# Patient Record
Sex: Female | Born: 1975 | Hispanic: Yes | Marital: Married | Smoking: Never smoker
Health system: Southern US, Community
[De-identification: ages and names within clinical notes are randomized; demographics above are authoritative.]

## PROBLEM LIST (undated history)

## (undated) DIAGNOSIS — D8989 Other specified disorders involving the immune mechanism, not elsewhere classified: Secondary | ICD-10-CM

## (undated) DIAGNOSIS — K219 Gastro-esophageal reflux disease without esophagitis: Secondary | ICD-10-CM

## (undated) DIAGNOSIS — IMO0001 Reserved for inherently not codable concepts without codable children: Secondary | ICD-10-CM

## (undated) DIAGNOSIS — J45909 Unspecified asthma, uncomplicated: Secondary | ICD-10-CM

## (undated) DIAGNOSIS — K209 Esophagitis, unspecified without bleeding: Secondary | ICD-10-CM

## (undated) HISTORY — PX: NERVE BIOPSY: SHX1015

## (undated) HISTORY — PX: TONSILLECTOMY: SUR1361

## (undated) HISTORY — PX: SEPTOPLASTY: SUR1290

---

## 2014-07-24 ENCOUNTER — Encounter (HOSPITAL_COMMUNITY): Payer: Self-pay | Admitting: Nurse Practitioner

## 2014-07-24 ENCOUNTER — Emergency Department (HOSPITAL_COMMUNITY): Payer: BLUE CROSS/BLUE SHIELD

## 2014-07-24 ENCOUNTER — Emergency Department (HOSPITAL_COMMUNITY)
Admission: EM | Admit: 2014-07-24 | Discharge: 2014-07-24 | Disposition: A | Discharge status: Home / Self Care | Payer: BLUE CROSS/BLUE SHIELD | Attending: Emergency Medicine | Admitting: Emergency Medicine

## 2014-07-24 DIAGNOSIS — K219 Gastro-esophageal reflux disease without esophagitis: Secondary | ICD-10-CM | POA: Insufficient documentation

## 2014-07-24 DIAGNOSIS — D7218 Eosinophilia in diseases classified elsewhere: Secondary | ICD-10-CM

## 2014-07-24 DIAGNOSIS — Z79899 Other long term (current) drug therapy: Secondary | ICD-10-CM | POA: Diagnosis not present

## 2014-07-24 DIAGNOSIS — R0602 Shortness of breath: Secondary | ICD-10-CM | POA: Diagnosis present

## 2014-07-24 DIAGNOSIS — R000 Tachycardia, unspecified: Secondary | ICD-10-CM | POA: Insufficient documentation

## 2014-07-24 DIAGNOSIS — Z7951 Long term (current) use of inhaled steroids: Secondary | ICD-10-CM | POA: Insufficient documentation

## 2014-07-24 DIAGNOSIS — I313 Pericardial effusion (noninflammatory): Secondary | ICD-10-CM | POA: Insufficient documentation

## 2014-07-24 DIAGNOSIS — Z880 Allergy status to penicillin: Secondary | ICD-10-CM | POA: Diagnosis not present

## 2014-07-24 DIAGNOSIS — M301 Polyarteritis with lung involvement [Churg-Strauss]: Secondary | ICD-10-CM

## 2014-07-24 DIAGNOSIS — J45901 Unspecified asthma with (acute) exacerbation: Secondary | ICD-10-CM | POA: Insufficient documentation

## 2014-07-24 DIAGNOSIS — I3139 Other pericardial effusion (noninflammatory): Secondary | ICD-10-CM

## 2014-07-24 DIAGNOSIS — R079 Chest pain, unspecified: Secondary | ICD-10-CM

## 2014-07-24 HISTORY — DX: Esophagitis, unspecified without bleeding: K20.90

## 2014-07-24 HISTORY — DX: Gastro-esophageal reflux disease without esophagitis: K21.9

## 2014-07-24 HISTORY — DX: Esophagitis, unspecified: K20.9

## 2014-07-24 HISTORY — DX: Reserved for inherently not codable concepts without codable children: IMO0001

## 2014-07-24 HISTORY — DX: Unspecified asthma, uncomplicated: J45.909

## 2014-07-24 HISTORY — DX: Other specified disorders involving the immune mechanism, not elsewhere classified: D89.89

## 2014-07-24 LAB — BASIC METABOLIC PANEL
ANION GAP: 8 (ref 5–15)
BUN: 6 mg/dL (ref 6–20)
CHLORIDE: 102 mmol/L (ref 101–111)
CO2: 27 mmol/L (ref 22–32)
Calcium: 9.2 mg/dL (ref 8.9–10.3)
Creatinine, Ser: 0.69 mg/dL (ref 0.44–1.00)
GFR calc Af Amer: >60 mL/min (ref >60)
GFR calc non Af Amer: >60 mL/min (ref >60)
Glucose, Bld: 100 mg/dL — ABNORMAL HIGH (ref 65–99)
Potassium: 3.5 mmol/L (ref 3.5–5.1)
SODIUM: 137 mmol/L (ref 135–145)

## 2014-07-24 LAB — DIFFERENTIAL
Basophils Absolute: 0.1 10*3/uL (ref 0.0–0.1)
Basophils Relative: 0 % (ref 0–1)
Eosinophils Absolute: 0.1 10*3/uL (ref 0.0–0.7)
Eosinophils Relative: 1 % (ref 0–5)
LYMPHS ABS: 0.9 10*3/uL (ref 0.7–4.0)
Lymphocytes Relative: 8 % — ABNORMAL LOW (ref 12–46)
MONOS PCT: 12 % (ref 3–12)
Monocytes Absolute: 1.3 10*3/uL — ABNORMAL HIGH (ref 0.1–1.0)
NEUTROS ABS: 9 10*3/uL — AB (ref 1.7–7.7)
Neutrophils Relative %: 79 % — ABNORMAL HIGH (ref 43–77)

## 2014-07-24 LAB — I-STAT TROPONIN, ED: Troponin i, poc: 0 ng/mL (ref 0.00–0.08)

## 2014-07-24 LAB — CBC
HCT: 43 % (ref 36.0–46.0)
HEMOGLOBIN: 14.5 g/dL (ref 12.0–15.0)
MCH: 30.8 pg (ref 26.0–34.0)
MCHC: 33.7 g/dL (ref 30.0–36.0)
MCV: 91.3 fL (ref 78.0–100.0)
PLATELETS: 316 10*3/uL (ref 150–400)
RBC: 4.71 MIL/uL (ref 3.87–5.11)
RDW: 14 % (ref 11.5–15.5)
WBC: 11.7 10*3/uL — ABNORMAL HIGH (ref 4.0–10.5)

## 2014-07-24 LAB — SEDIMENTATION RATE: Sed Rate: 24 mm/hr — ABNORMAL HIGH (ref 0–22)

## 2014-07-24 LAB — HCG, SERUM, QUALITATIVE: Preg, Serum: NEGATIVE

## 2014-07-24 LAB — BRAIN NATRIURETIC PEPTIDE: B Natriuretic Peptide: 29.3 pg/mL (ref 0.0–100.0)

## 2014-07-24 MED ORDER — HYDROCODONE-ACETAMINOPHEN 5-325 MG PO TABS: 1.0000 | ORAL_TABLET | ORAL | Status: DC | PRN

## 2014-07-24 MED ORDER — IOHEXOL 350 MG/ML SOLN
80.0000 mL | Freq: Once | INTRAVENOUS | Status: AC | PRN
  Administered 2014-07-24: 80 mL via INTRAVENOUS

## 2014-07-24 MED ORDER — SUCRALFATE 1 G PO TABS: 1.0000 g | ORAL_TABLET | Freq: Four times a day (QID) | ORAL | Status: DC

## 2014-07-24 MED ORDER — ONDANSETRON HCL 4 MG/2ML IJ SOLN
4.0000 mg | Freq: Once | INTRAMUSCULAR | Status: AC
  Administered 2014-07-24: 4 mg via INTRAVENOUS
  Filled 2014-07-24: qty 2

## 2014-07-24 MED ORDER — PREDNISONE 10 MG PO TABS: 30.0000 mg | ORAL_TABLET | Freq: Two times a day (BID) | ORAL | Status: DC

## 2014-07-24 MED ORDER — MORPHINE SULFATE 4 MG/ML IJ SOLN
4.0000 mg | INTRAMUSCULAR | Status: DC | PRN
  Administered 2014-07-24: 4 mg via INTRAVENOUS
  Filled 2014-07-24: qty 1

## 2014-07-24 MED ORDER — METHYLPREDNISOLONE SODIUM SUCC 125 MG IJ SOLR
125.0000 mg | Freq: Once | INTRAMUSCULAR | Status: AC
  Administered 2014-07-24: 125 mg via INTRAVENOUS
  Filled 2014-07-24: qty 2

## 2014-07-24 NOTE — Discharge Instructions (Signed)
Continue with your follow-up appointment on Tuesday with your rheumatologist.  Increase your prednisone to 30 mg per day until your follow-up appointment on Tuesday.  Return with any worsening of symptoms including pain, dizziness, lightheadedness, shortness of breath.

## 2014-07-24 NOTE — ED Notes (Addendum)
She just moved here from [place] and was recently dx with chung strauss syndrome. She reports it causes her to have multiple physical symptoms and complaints. She began to have CP and SOB on wed that she felt was related to the dx but symptoms have gotten worse. She is able to speak in full sentences now but does become mildly labored, A&Ox4

## 2014-07-24 NOTE — ED Notes (Signed)
Patient transported to CT 

## 2014-07-24 NOTE — ED Provider Notes (Signed)
CSN: [id number]     Arrival date & time 07/24/14  1248 History   First MD Initiated Contact with Patient 07/24/14 1314     Chief Complaint  Patient presents with  . Shortness of Breath      HPI  Pt presents with a cc of sob and chest pain.  H/O asthma since 39 y/o.  Patient underwent extensive evaluation after months of exacerbation of symptoms including chest pain. M have pleural effusion. 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  Had small pericardial effusion as well. She moved from [place] to [place] only 2 weeks ago. Has been on a prednisone taper and is down to 5 mg per day. Over the last week has developed recurrence of chest pressure and shortness of breath feels more short of breath lying supine at night. Does not feel like her typical asthma. She presents here.  Nose accompanied her her effusions have been sterilely responsive. She has not required paracentesis, or pericardiocentesis.  Past Medical History  Diagnosis Date  . Autoimmune disorder   . Esophagitis   . Asthma   . Reflux    Past Surgical History  Procedure Laterality Date  . Cesarean section    . Septoplasty    . Tonsillectomy    . Nerve biopsy     History reviewed. No pertinent family history. History  Substance Use Topics  . Smoking status: Never Smoker   . Smokeless tobacco: Not on file  . Alcohol Use: No   OB History    No data available     Review of Systems  Constitutional: Negative for fever, chills, diaphoresis, appetite change and fatigue.  HENT: Negative for mouth sores, sore throat and trouble swallowing.   Eyes: Negative for visual disturbance.  Respiratory: Positive for chest tightness and shortness of breath. Negative for cough and wheezing.   Cardiovascular: Positive for chest pain.  Gastrointestinal: Negative for nausea, vomiting, abdominal pain, diarrhea and abdominal distention.  Endocrine: Negative for polydipsia, polyphagia and  polyuria.  Genitourinary: Negative for dysuria, frequency and hematuria.  Musculoskeletal: Negative for gait problem.  Skin: Negative for color change, pallor and rash.  Neurological: Negative for dizziness, syncope, light-headedness and headaches.  Hematological: Does not bruise/bleed easily.  Psychiatric/Behavioral: Negative for behavioral problems and confusion.      Allergies  Benadryl; Penicillins; and Zantac  Home Medications   Prior to Admission medications   Medication Sig Start Date End Date Taking? Authorizing Provider  azaTHIOprine (IMURAN) 50 MG tablet Take 100 mg by mouth daily.   Yes Historical Provider, MD  Biotin 10 MG TABS Take 10 mg by mouth daily.   Yes Historical Provider, MD  Cyanocobalamin (B-12) 1000 MCG/ML KIT Inject 1,000 mg as directed every 30 (thirty) days.   Yes Historical Provider, MD  Cyanocobalamin (B-12) 5000 MCG CAPS Take 10,000 Units by mouth daily.   Yes Historical Provider, MD  Ferric Carboxymaltose (INJECTAFER) 750 MG/15ML SOLN Inject 750 mg into the vein every 30 (thirty) days.   Yes Historical Provider, MD  fluticasone (FLONASE) 50 MCG/ACT nasal spray Place 2 sprays into both nostrils daily.   Yes Historical Provider, MD  FOLIC ACID PO Take 1 tablet by mouth daily.   Yes Historical Provider, MD  furosemide (LASIX) 20 MG tablet Take 20 mg by mouth as needed for fluid or edema.    Yes Historical Provider, MD  HYDROcodone-acetaminophen (NORCO/VICODIN) 5-325 MG per tablet Take 1 tablet by mouth every 4 (four) hours as needed.  07/24/14   Tanna Furry, MD  ibuprofen (ADVIL,MOTRIN) 200 MG tablet Take 800 mg by mouth every 6 (six) hours as needed for moderate pain.   Yes Historical Provider, MD  lansoprazole (PREVACID) 30 MG capsule Take 30 mg by mouth daily at 12 noon.   Yes Historical Provider, MD  predniSONE (DELTASONE) 10 MG tablet Take 3 tablets (30 mg total) by mouth 2 (two) times daily with a meal. 07/24/14   Tanna Furry, MD  sucralfate (CARAFATE) 1 G  tablet Take 1 tablet (1 g total) by mouth 4 (four) times daily. 07/24/14   Tanna Furry, MD  traMADol (ULTRAM) 50 MG tablet Take 50 mg by mouth every 6 (six) hours as needed for moderate pain.   Yes Historical Provider, MD   BP 106/66 mmHg  Pulse 115  Temp(Src) 98.5 F (36.9 C) (Oral)  Resp 20  Ht 5' 10"  (1.778 m)  Wt 265 lb (120.203 kg)  BMI 38.02 kg/m2  SpO2 95%  LMP 07/17/2014 Physical Exam  Constitutional: She is oriented to person, place, and time. She appears well-developed and well-nourished. No distress.  HENT:  Head: Normocephalic.  Eyes: Conjunctivae are normal. Pupils are equal, round, and reactive to light. No scleral icterus.  Neck: Normal range of motion. Neck supple. No thyromegaly present.  Cardiovascular: Regular rhythm.  Tachycardia present.  Exam reveals no gallop and no friction rub.   No murmur heard. Pulmonary/Chest: Effort normal and breath sounds normal. No respiratory distress. She has no wheezes. She has no rales.  CTA bilaterally.  Abdominal: Soft. Bowel sounds are normal. She exhibits no distension. There is no tenderness. There is no rebound.  Musculoskeletal: Normal range of motion.  Neurological: She is alert and oriented to person, place, and time.  Skin: Skin is warm and dry. No rash noted.  Psychiatric: She has a normal mood and affect. Her behavior is normal.    ED Course  Procedures (including critical care time) Labs Review Labs Reviewed  CBC - Abnormal; Notable for the following:    WBC 11.7 (*)    All other components within normal limits  BASIC METABOLIC PANEL - Abnormal; Notable for the following:    Glucose, Bld 100 (*)    All other components within normal limits  SEDIMENTATION RATE - Abnormal; Notable for the following:    Sed Rate 24 (*)    All other components within normal limits  DIFFERENTIAL - Abnormal; Notable for the following:    Neutrophils Relative % 79 (*)    Neutro Abs 9.0 (*)    Lymphocytes Relative 8 (*)     Monocytes Absolute 1.3 (*)    All other components within normal limits  BRAIN NATRIURETIC PEPTIDE  HCG, SERUM, QUALITATIVE  I-STAT TROPOININ, ED    Imaging Review No results found.   EKG Interpretation   Date/Time:  Saturday July 24 2014 14:35:06 EDT Ventricular Rate:  114 PR Interval:  139 QRS Duration: 90 QT Interval:  464 QTC Calculation: 639 R Axis:   58 Text Interpretation:  Sinus tachycardia Prolonged QT interval Confirmed by  Jeneen Rinks  MD, Rawlings ([id number]) on 07/24/2014 3:24:08 PM      MDM   Final diagnoses:  Chest pain  Churg-Strauss syndrome  Pericardial effusion    Patient underwent CT angios the chest. It shows a small right pleural effusion, and small pericardial effusion. She is not hypotensive. Her tachycardia improves. She is not dyspneic with exertion. States she feels "much much better. I reviewed her extensive paperwork  that she brings with her including discharge summary and notes from her physicians in [place]. They describe her previous effusion as "small, and sterilely responsive". She is quite articulate about her disease. She is given IV's or Lloyd's here. She is given careful return precautions. Cardiology and pulmonary follow-up. Discharged home. Return if any worsening of shortness of breath, chest pain, lightheadedness dizziness weakness fever or other new or worsening symptoms.   Tanna Furry, MD 07/27/14 1524

## 2015-06-15 ENCOUNTER — Emergency Department (HOSPITAL_COMMUNITY)
Admission: EM | Admit: 2015-06-15 | Discharge: 2015-06-15 | Disposition: A | Discharge status: Home / Self Care | Payer: Medicaid Other | Attending: Emergency Medicine | Admitting: Emergency Medicine

## 2015-06-15 ENCOUNTER — Encounter (HOSPITAL_COMMUNITY): Payer: Self-pay | Admitting: Nurse Practitioner

## 2015-06-15 ENCOUNTER — Emergency Department (HOSPITAL_COMMUNITY): Payer: Medicaid Other

## 2015-06-15 DIAGNOSIS — Z7952 Long term (current) use of systemic steroids: Secondary | ICD-10-CM | POA: Diagnosis not present

## 2015-06-15 DIAGNOSIS — Z79899 Other long term (current) drug therapy: Secondary | ICD-10-CM | POA: Diagnosis not present

## 2015-06-15 DIAGNOSIS — Z7951 Long term (current) use of inhaled steroids: Secondary | ICD-10-CM | POA: Diagnosis not present

## 2015-06-15 DIAGNOSIS — Z880 Allergy status to penicillin: Secondary | ICD-10-CM | POA: Diagnosis not present

## 2015-06-15 DIAGNOSIS — K219 Gastro-esophageal reflux disease without esophagitis: Secondary | ICD-10-CM | POA: Diagnosis not present

## 2015-06-15 DIAGNOSIS — J45901 Unspecified asthma with (acute) exacerbation: Secondary | ICD-10-CM | POA: Insufficient documentation

## 2015-06-15 DIAGNOSIS — R079 Chest pain, unspecified: Secondary | ICD-10-CM | POA: Insufficient documentation

## 2015-06-15 DIAGNOSIS — R0602 Shortness of breath: Secondary | ICD-10-CM

## 2015-06-15 DIAGNOSIS — M797 Fibromyalgia: Secondary | ICD-10-CM | POA: Diagnosis not present

## 2015-06-15 LAB — CBC WITH DIFFERENTIAL/PLATELET
BASOS PCT: 1 %
Basophils Absolute: 0 10*3/uL (ref 0.0–0.1)
EOS ABS: 0.6 10*3/uL (ref 0.0–0.7)
EOS PCT: 7 %
HCT: 40.6 % (ref 36.0–46.0)
HEMOGLOBIN: 13.1 g/dL (ref 12.0–15.0)
LYMPHS ABS: 1.7 10*3/uL (ref 0.7–4.0)
Lymphocytes Relative: 20 %
MCH: 28.9 pg (ref 26.0–34.0)
MCHC: 32.3 g/dL (ref 30.0–36.0)
MCV: 89.6 fL (ref 78.0–100.0)
MONOS PCT: 12 %
Monocytes Absolute: 1 10*3/uL (ref 0.1–1.0)
NEUTROS PCT: 60 %
Neutro Abs: 5.4 10*3/uL (ref 1.7–7.7)
PLATELETS: 314 10*3/uL (ref 150–400)
RBC: 4.53 MIL/uL (ref 3.87–5.11)
RDW: 13.5 % (ref 11.5–15.5)
WBC: 8.7 10*3/uL (ref 4.0–10.5)

## 2015-06-15 LAB — BASIC METABOLIC PANEL
Anion gap: 9 (ref 5–15)
BUN: 12 mg/dL (ref 6–20)
CALCIUM: 9 mg/dL (ref 8.9–10.3)
CHLORIDE: 106 mmol/L (ref 101–111)
CO2: 25 mmol/L (ref 22–32)
CREATININE: 0.54 mg/dL (ref 0.44–1.00)
Glucose, Bld: 112 mg/dL — ABNORMAL HIGH (ref 65–99)
Potassium: 3.4 mmol/L — ABNORMAL LOW (ref 3.5–5.1)
SODIUM: 140 mmol/L (ref 135–145)

## 2015-06-15 LAB — I-STAT TROPONIN, ED: TROPONIN I, POC: 0 ng/mL (ref 0.00–0.08)

## 2015-06-15 MED ORDER — ASPIRIN 81 MG PO CHEW
324.0000 mg | CHEWABLE_TABLET | Freq: Once | ORAL | Status: AC
  Administered 2015-06-15: 324 mg via ORAL
  Filled 2015-06-15: qty 4

## 2015-06-15 MED ORDER — IOPAMIDOL (ISOVUE-370) INJECTION 76%
100.0000 mL | Freq: Once | INTRAVENOUS | Status: AC | PRN
  Administered 2015-06-15: 100 mL via INTRAVENOUS

## 2015-06-15 MED ORDER — KETOROLAC TROMETHAMINE 30 MG/ML IJ SOLN
30.0000 mg | Freq: Once | INTRAMUSCULAR | Status: AC
  Administered 2015-06-15: 30 mg via INTRAVENOUS
  Filled 2015-06-15: qty 1

## 2015-06-15 NOTE — ED Notes (Signed)
PA at bedside.

## 2015-06-15 NOTE — ED Notes (Signed)
Patient presents today for complaints of chest pain and shortness of breath. Patient rates pain at a 6 and describes it as sharp and burning. She has not taken anything to help with the pain. She has a history of pericarditis, pleural effusions, churg-strauss syndrome, and fibromyalgia. She feels that her current pain is similar to that of previous episodes of pericarditis.

## 2015-06-15 NOTE — ED Provider Notes (Signed)
CSN: [id number]     Arrival date & time 06/15/15  1828 History   First MD Initiated Contact with Patient 06/15/15 1900     Chief Complaint  Patient presents with  . Chest Pain  . Shortness of Breath     (Consider location/radiation/quality/duration/timing/severity/associated sxs/prior Treatment) HPI Comments: Patient with a history of Pleural Effusion, Pericarditis, and Fibromyalgia presents today with complaints of chest pain and SOB.  She reports onset of chest pain yesterday and states that it has been constant since that time.  She describes it as a pressure and also feeling tight.  Pain is located left anterior chest and radiates through to the back.  She reports that the pain is worse with movement, when lying down, and also when taking a deep breath.  She has not taken anything for pain prior to arrival.  She denies fever, chills, cough, hemoptysis, nausea, vomiting, LE pain/swelling, dizziness, or syncope.  She denies any prior history of CAD.  She denies history of PE or DVT.  No prolonged travel or surgeries in the past 4 weeks.  No exogenous estrogen use.    Patient is a 40 y.o. female presenting with chest pain and shortness of breath. The history is provided by the patient.  Chest Pain Associated symptoms: shortness of breath   Shortness of Breath Associated symptoms: chest pain     Past Medical History  Diagnosis Date  . Autoimmune disorder (HCC)   . Esophagitis   . Asthma   . Reflux    Past Surgical History  Procedure Laterality Date  . Cesarean section    . Septoplasty    . Tonsillectomy    . Nerve biopsy     Family History  Problem Relation Age of Onset  . Asthma Mother   . Thyroid disease Mother   . Hypertension Mother   . Anxiety disorder Mother   . Hyperlipidemia Mother   . Varicose Veins Mother   . Hypertension Father   . Asthma Sister   . Migraines Sister   . Hypertension Sister   . Asthma Brother   . Thrombophlebitis Brother   . Hypertension  Brother   . Diabetes Brother    Social History  Substance Use Topics  . Smoking status: Never Smoker   . Smokeless tobacco: None  . Alcohol Use: No   OB History    Gravida Para Term Preterm AB TAB SAB Ectopic Multiple Living   1 0 0 0 0 0 0 0 0 0      Review of Systems  Respiratory: Positive for shortness of breath.   Cardiovascular: Positive for chest pain.  All other systems reviewed and are negative.     Allergies  Benadryl; Penicillins; and Zantac  Home Medications   Prior to Admission medications   Medication Sig Start Date End Date Taking? Authorizing Provider  azaTHIOprine (IMURAN) 50 MG tablet Take 100 mg by mouth daily.   Yes Historical Provider, MD  Biotin 10 MG TABS Take 10 mg by mouth daily.   Yes Historical Provider, MD  Cyanocobalamin (B-12) 5000 MCG CAPS Take 10,000 Units by mouth every other day.    Yes Historical Provider, MD  fluticasone (FLONASE) 50 MCG/ACT nasal spray Place 2 sprays into both nostrils daily.   Yes Historical Provider, MD  FOLIC ACID PO Take 1 tablet by mouth daily.   Yes Historical Provider, MD  ibuprofen (ADVIL,MOTRIN) 200 MG tablet Take 800 mg by mouth every 6 (six) hours as needed for moderate pain.  Yes Historical Provider, MD  lansoprazole (PREVACID) 30 MG capsule Take 30 mg by mouth 2 (two) times daily.    Yes Historical Provider, MD  predniSONE (DELTASONE) 5 MG tablet Take 5 mg by mouth daily with breakfast.  06/03/15  Yes Historical Provider, MD  HYDROcodone-acetaminophen (NORCO/VICODIN) 5-325 MG per tablet Take 1 tablet by mouth every 4 (four) hours as needed. Patient not taking: Reported on 06/15/2015 07/24/14   Rolland PorterMark James, MD  predniSONE (DELTASONE) 10 MG tablet Take 3 tablets (30 mg total) by mouth 2 (two) times daily with a meal. Patient not taking: Reported on 06/15/2015 07/24/14   Rolland PorterMark James, MD  sucralfate (CARAFATE) 1 G tablet Take 1 tablet (1 g total) by mouth 4 (four) times daily. Patient not taking: Reported on 06/15/2015  07/24/14   Rolland PorterMark James, MD   BP 128/79 mmHg  Pulse 91  Resp 17  SpO2 99%  LMP 06/15/2015 (Exact Date) Physical Exam  Constitutional: She appears well-developed and well-nourished.  HENT:  Head: Normocephalic and atraumatic.  Mouth/Throat: Oropharynx is clear and moist.  Neck: Normal range of motion. Neck supple.  Cardiovascular: Normal rate, regular rhythm and normal heart sounds.  Exam reveals no friction rub.   No murmur heard. Pulmonary/Chest: Effort normal and breath sounds normal. No respiratory distress. She has no wheezes. She has no rales. She exhibits tenderness.  Abdominal: Soft. Bowel sounds are normal. She exhibits no distension and no mass. There is no tenderness. There is no rebound and no guarding.  Musculoskeletal: Normal range of motion.  Neurological: She is alert.  Skin: Skin is warm and dry. She is not diaphoretic.  Psychiatric: She has a normal mood and affect.  Nursing note and vitals reviewed.   ED Course  Procedures (including critical care time) Labs Review Labs Reviewed  BASIC METABOLIC PANEL - Abnormal; Notable for the following:    Potassium 3.4 (*)    Glucose, Bld 112 (*)    All other components within normal limits  CBC WITH DIFFERENTIAL/PLATELET  [facility] SensorI-STAT TROPOININ, ED    Imaging Review Dg Chest 2 View  06/15/2015  CLINICAL DATA:  40 year old female pain with chest pain EXAM: CHEST  2 VIEW COMPARISON:  None. FINDINGS: The heart size and mediastinal contours are within normal limits. Both lungs are clear. The visualized skeletal structures are unremarkable. IMPRESSION: No active cardiopulmonary disease. Electronically Signed   By: Elgie CollardArash  Radparvar M.D.   On: 06/15/2015 20:21   Ct Angio Chest Pe W/cm &/or Wo Cm  06/15/2015  CLINICAL DATA:  Left-sided chest pain and shortness of breath for 2 days. EXAM: CT ANGIOGRAPHY CHEST WITH CONTRAST TECHNIQUE: Multidetector CT imaging of the chest was performed using the standard protocol during bolus  administration of intravenous contrast. Multiplanar CT image reconstructions and MIPs were obtained to evaluate the vascular anatomy. CONTRAST:  100 mL Isovue 370 COMPARISON:  None. FINDINGS: Mediastinum/Lymph Nodes: No pulmonary emboli or thoracic aortic dissection identified. No evidence thoracic aortic aneurysm. Normal heart size. No masses or pathologically enlarged lymph nodes identified. Lungs/Pleura: No pulmonary mass, infiltrate, or effusion. Upper abdomen: No acute findings. Musculoskeletal: No chest wall mass or suspicious bone lesions identified. Incidental note made of a benign-appearing hemangioma in the T9 vertebral body. Review of the MIP images confirms the above findings. IMPRESSION: Negative. No evidence of pulmonary embolism or other active disease within the thorax. Electronically Signed   By: Myles RosenthalJohn  Stahl M.D.   On: 06/15/2015 21:30   I have personally reviewed and evaluated these images  and lab results as part of my medical decision-making.   EKG Interpretation None      MDM   Final diagnoses:  Chest pain   Patient presents today with complaints of SOB and chest pain.  She has a history of Pleural Effusion and Pericarditis and states that symptoms feel similar.  EKG without ischemic changes.  Troponin negative.  CXR is negative.  Discussed CXR results with the patient.  She is very concerned and is requesting a Chest CT.  She states that she has had abnormalities on Chest CT that has not shown up on CXR in the past and has had prolonged hospitalizations for this.  Chest CT today is negative.  Patient is not hypoxic and does not have any signs of respiratory distress.  Stable for discharge.  Return precautions given.    Santiago Glad, PA-C 06/16/15 0020  Bethann Berkshire, MD 06/16/15 [phone]

## 2015-06-15 NOTE — ED Notes (Signed)
Bed: [id number] Expected date: 06/15/15 Expected time:  Means of arrival:  Comments: Hold for Triage 2

## 2015-10-11 ENCOUNTER — Emergency Department (HOSPITAL_COMMUNITY)
Admission: EM | Admit: 2015-10-11 | Discharge: 2015-10-11 | Disposition: A | Discharge status: Home / Self Care | Payer: BLUE CROSS/BLUE SHIELD | Attending: Emergency Medicine | Admitting: Emergency Medicine

## 2015-10-11 ENCOUNTER — Encounter (HOSPITAL_COMMUNITY): Payer: Self-pay

## 2015-10-11 ENCOUNTER — Emergency Department (HOSPITAL_COMMUNITY): Payer: BLUE CROSS/BLUE SHIELD

## 2015-10-11 DIAGNOSIS — Z79899 Other long term (current) drug therapy: Secondary | ICD-10-CM | POA: Insufficient documentation

## 2015-10-11 DIAGNOSIS — J45909 Unspecified asthma, uncomplicated: Secondary | ICD-10-CM | POA: Insufficient documentation

## 2015-10-11 DIAGNOSIS — K30 Functional dyspepsia: Secondary | ICD-10-CM | POA: Insufficient documentation

## 2015-10-11 DIAGNOSIS — R0789 Other chest pain: Secondary | ICD-10-CM

## 2015-10-11 LAB — BASIC METABOLIC PANEL
ANION GAP: 6 (ref 5–15)
BUN: 13 mg/dL (ref 6–20)
CO2: 30 mmol/L (ref 22–32)
Calcium: 9.4 mg/dL (ref 8.9–10.3)
Chloride: 101 mmol/L (ref 101–111)
Creatinine, Ser: 0.73 mg/dL (ref 0.44–1.00)
Glucose, Bld: 88 mg/dL (ref 65–99)
POTASSIUM: 3.5 mmol/L (ref 3.5–5.1)
SODIUM: 137 mmol/L (ref 135–145)

## 2015-10-11 LAB — CBC
HEMATOCRIT: 37.5 % (ref 36.0–46.0)
Hemoglobin: 12.5 g/dL (ref 12.0–15.0)
MCH: 30 pg (ref 26.0–34.0)
MCHC: 33.3 g/dL (ref 30.0–36.0)
MCV: 89.9 fL (ref 78.0–100.0)
Platelets: 338 10*3/uL (ref 150–400)
RBC: 4.17 MIL/uL (ref 3.87–5.11)
RDW: 13.4 % (ref 11.5–15.5)
WBC: 13.4 10*3/uL — AB (ref 4.0–10.5)

## 2015-10-11 LAB — I-STAT TROPONIN, ED: Troponin i, poc: 0 ng/mL (ref 0.00–0.08)

## 2015-10-11 MED ORDER — ALUM & MAG HYDROXIDE-SIMETH 200-200-20 MG/5ML PO SUSP
15.0000 mL | Freq: Once | ORAL | Status: AC
  Administered 2015-10-11: 15 mL via ORAL
  Filled 2015-10-11: qty 30

## 2015-10-11 MED ORDER — LIDOCAINE VISCOUS 2 % MT SOLN
10.0000 mL | Freq: Once | OROMUCOSAL | Status: AC
  Administered 2015-10-11: 10 mL via OROMUCOSAL
  Filled 2015-10-11: qty 15

## 2015-10-11 MED ORDER — OMEPRAZOLE 20 MG PO CPDR: 20.0000 mg | DELAYED_RELEASE_CAPSULE | Freq: Every day | ORAL | 0 refills | Status: DC

## 2015-10-11 NOTE — Discharge Instructions (Signed)
Please read attached information. If you experience any new or worsening signs or symptoms please return to the emergency room for evaluation. Please follow-up with your primary care provider or specialist as discussed. Please use medication prescribed only as directed and discontinue taking if you have any concerning signs or symptoms.   °

## 2015-10-11 NOTE — ED Triage Notes (Signed)
Pt states that she has had chest pain and SOB since Saturday. 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 Alert and oriented.

## 2015-10-11 NOTE — ED Provider Notes (Signed)
[facility]-EMERGENCY DEPT Provider Note   CSN: [id number] Arrival date & time: 10/11/15  1801   History   Chief Complaint Chief Complaint  Patient presents with  . Chest Pain  . Shortness of Breath    HPI Allison Obrien is a 40 y.o. female.  HPI   40 year old female presents today with numerous complaints. Patient reports that she is on probiotics and digestive enzymes. She notes that one week ago she stopped using these as she could not longer afford them. She reports shortly after she started to develop indigestion, chest pain, upper abdominal pain. She reports the symptoms are similar to previous, reports that symptoms are worse when laying back. She notes that she received her medications in the mail yesterday and will begin taking them tomorrow. Patient is concerned as she's had similar symptoms in the past. Chart review shows the patient has had numerous ED visits for similar presentations, requesting CT and urinalysis showing no pulmonary embolism, and also very small pleural effusions. Patient denies any history of DVT or PE, she denies any significant risk factors today. Patient denies any significant cardiac history. Patient also reports that the chest pain is reproducible, noting extreme tenderness to palpation of her sternum. Symptoms are worse after eating.   Past Medical History:  Diagnosis Date  . Asthma   . Autoimmune disorder (HCC)   . Esophagitis   . Reflux     There are no active problems to display for this patient.   Past Surgical History:  Procedure Laterality Date  . CESAREAN SECTION    . NERVE BIOPSY    . SEPTOPLASTY    . TONSILLECTOMY      OB History    Gravida Para Term Preterm AB Living   1 0 0 0 0 0   SAB TAB Ectopic Multiple Live Births   0 0 0 0         Home Medications    Prior to Admission medications   Medication Sig Start Date End Date Taking? Authorizing Provider  azaTHIOprine (IMURAN) 50 MG tablet Take 100 mg by mouth daily.    Yes Historical Provider, MD  Biotin 10 MG TABS Take 10 mg by mouth daily.   Yes Historical Provider, MD  Cyanocobalamin (B-12) 5000 MCG CAPS Take 10,000 Units by mouth every other day.    Yes Historical Provider, MD  fluticasone (FLONASE) 50 MCG/ACT nasal spray Place 2 sprays into both nostrils daily.   Yes Historical Provider, MD  FOLIC ACID PO Take 1 tablet by mouth daily.   Yes Historical Provider, MD  ibuprofen (ADVIL,MOTRIN) 200 MG tablet Take 800 mg by mouth every 6 (six) hours as needed for moderate pain.   Yes Historical Provider, MD  lansoprazole (PREVACID) 30 MG capsule Take 30 mg by mouth 2 (two) times daily.    Yes Historical Provider, MD  predniSONE (DELTASONE) 5 MG tablet Take 5 mg by mouth daily with breakfast.  06/03/15  Yes Historical Provider, MD  HYDROcodone-acetaminophen (NORCO/VICODIN) 5-325 MG per tablet Take 1 tablet by mouth every 4 (four) hours as needed. Patient not taking: Reported on 06/15/2015 07/24/14   Rolland Porter, MD  omeprazole (PRILOSEC) 20 MG capsule Take 1 capsule (20 mg total) by mouth daily. 10/11/15   Eyvonne Mechanic, PA-C  predniSONE (DELTASONE) 10 MG tablet Take 3 tablets (30 mg total) by mouth 2 (two) times daily with a meal. Patient not taking: Reported on 06/15/2015 07/24/14   Rolland Porter, MD  sucralfate (  CARAFATE) 1 G tablet Take 1 tablet (1 g total) by mouth 4 (four) times daily. Patient not taking: Reported on 06/15/2015 07/24/14   Rolland Porter, MD    Family History Family History  Problem Relation Age of Onset  . Asthma Mother   . Thyroid disease Mother   . Hypertension Mother   . Anxiety disorder Mother   . Hyperlipidemia Mother   . Varicose Veins Mother   . Hypertension Father   . Asthma Sister   . Migraines Sister   . Hypertension Sister   . Asthma Brother   . Thrombophlebitis Brother   . Hypertension Brother   . Diabetes Brother     Social History Social History  Substance Use Topics  . Smoking status: Never Smoker  . Smokeless tobacco:  Not on file  . Alcohol use No     Allergies   Benadryl [diphenhydramine]; Penicillins; and Zantac [ranitidine hcl]   Review of Systems Review of Systems  All other systems reviewed and are negative.   Physical Exam Updated Vital Signs BP 133/78   Pulse 62   Temp 98.1 F (36.7 C) (Oral)   Resp 18   LMP 10/04/2015 (Approximate)   SpO2 100%   Physical Exam  Constitutional: She is oriented to person, place, and time. She appears well-developed and well-nourished.  HENT:  Head: Normocephalic and atraumatic.  Eyes: Conjunctivae are normal. Pupils are equal, round, and reactive to light. Right eye exhibits no discharge. Left eye exhibits no discharge. No scleral icterus.  Neck: Normal range of motion. No JVD present. No tracheal deviation present.  Cardiovascular: Normal rate, regular rhythm, normal heart sounds and intact distal pulses.  Exam reveals no gallop and no friction rub.   No murmur heard. Pulmonary/Chest: Effort normal and breath sounds normal. No stridor. No respiratory distress. She has no wheezes. She has no rales. She exhibits no tenderness.  Exquisite tenderness to light palpation of the sternum  Abdominal: Soft. She exhibits no distension. There is no tenderness.  Musculoskeletal: She exhibits no edema.  Neurological: She is alert and oriented to person, place, and time. Coordination normal.  Skin: Skin is warm and dry.  Psychiatric: She has a normal mood and affect. Her behavior is normal. Judgment and thought content normal.  Nursing note and vitals reviewed.   ED Treatments / Results  Labs (all labs ordered are listed, but only abnormal results are displayed) Labs Reviewed  CBC - Abnormal; Notable for the following:       Result Value   WBC 13.4 (*)    All other components within normal limits  BASIC METABOLIC PANEL  I-STAT TROPOININ, ED    EKG  EKG Interpretation None       Radiology Dg Chest 2 View  Result Date: 10/11/2015 CLINICAL DATA:   Chest pain, shortness of breath. EXAM: CHEST  2 VIEW COMPARISON:  Radiographs of Jun 15, 2015. FINDINGS: The heart size and mediastinal contours are within normal limits. Both lungs are clear. No pneumothorax or pleural effusion is noted. The visualized skeletal structures are unremarkable. IMPRESSION: No active cardiopulmonary disease. Electronically Signed   By: Lupita Raider, M.D.   On: 10/11/2015 19:30    Procedures Procedures (including critical care time)  Medications Ordered in ED Medications  lidocaine (XYLOCAINE) 2 % viscous mouth solution 10 mL (10 mLs Mouth/Throat Given 10/11/15 2114)  alum & mag hydroxide-simeth (MAALOX/MYLANTA) 200-200-20 MG/5ML suspension 15 mL (15 mLs Oral Given 10/11/15 2127)     Initial Impression /  Assessment and Plan / ED Course  I have reviewed the triage vital signs and the nursing notes.  Pertinent labs & imaging results that were available during my care of the patient were reviewed by me and considered in my medical decision making (see chart for details).  Clinical Course    Final Clinical Impressions(s) / ED Diagnoses   Final diagnoses:  Indigestion  Chest wall pain   Labs: I-STAT troponin, BNP, CBC  Imaging: DG chest 2 view  Consults:  Therapeutics:  Discharge Meds:   Assessment/Plan:  40-year-old female presents today with numerous complaints. Patient maintained complaint is indigestion and chest pain. This is likely due to recent change in provider X and enzymes support. Patient reports symptoms are worse after eating, mildly improved here in the ED with lidocaine and Maalox. Very low suspicion for any cardiovascular, pulmonary etiology in this patient. Patient has no adventitious lung sounds or signs of pleural effusions. She has a clean chest x-ray and reassuring vital signs. Patient was tachycardic initially here in the ED, she was anxious and distracted by the events on the TV. Patient will be instructed to use omeprazole, follow  up with primary care for reevaluation. She is given strict return precautions, she verbalized understanding and agreement to today's plan had no further questions or concerns at time of discharge     New Prescriptions Discharge Medication List as of 10/11/2015 10:50 PM    START taking these medications   Details  omeprazole (PRILOSEC) 20 MG capsule Take 1 capsule (20 mg total) by mouth daily., Starting Tue 10/11/2015, Print         Eyvonne MechanicJeffrey Amulya Quintin, PA-C 10/12/15 [id number]    Benjiman CoreNathan Pickering, MD 10/12/15 [phone]

## 2015-10-12 ENCOUNTER — Emergency Department (HOSPITAL_COMMUNITY)
Admission: EM | Admit: 2015-10-12 | Discharge: 2015-10-13 | Disposition: A | Discharge status: Home / Self Care | Payer: BLUE CROSS/BLUE SHIELD | Attending: Emergency Medicine | Admitting: Emergency Medicine

## 2015-10-12 ENCOUNTER — Emergency Department (HOSPITAL_COMMUNITY): Payer: BLUE CROSS/BLUE SHIELD

## 2015-10-12 ENCOUNTER — Encounter (HOSPITAL_COMMUNITY): Payer: Self-pay | Admitting: Emergency Medicine

## 2015-10-12 DIAGNOSIS — J45909 Unspecified asthma, uncomplicated: Secondary | ICD-10-CM | POA: Insufficient documentation

## 2015-10-12 DIAGNOSIS — J189 Pneumonia, unspecified organism: Secondary | ICD-10-CM | POA: Diagnosis not present

## 2015-10-12 DIAGNOSIS — R0602 Shortness of breath: Secondary | ICD-10-CM | POA: Diagnosis present

## 2015-10-12 DIAGNOSIS — Z79899 Other long term (current) drug therapy: Secondary | ICD-10-CM | POA: Insufficient documentation

## 2015-10-12 DIAGNOSIS — Z8679 Personal history of other diseases of the circulatory system: Secondary | ICD-10-CM

## 2015-10-12 LAB — CBC
HEMATOCRIT: 40.7 % (ref 36.0–46.0)
HEMOGLOBIN: 13 g/dL (ref 12.0–15.0)
MCH: 29.3 pg (ref 26.0–34.0)
MCHC: 31.9 g/dL (ref 30.0–36.0)
MCV: 91.9 fL (ref 78.0–100.0)
Platelets: 321 10*3/uL (ref 150–400)
RBC: 4.43 MIL/uL (ref 3.87–5.11)
RDW: 13.5 % (ref 11.5–15.5)
WBC: 15.1 10*3/uL — AB (ref 4.0–10.5)

## 2015-10-12 LAB — I-STAT TROPONIN, ED: Troponin i, poc: 0 ng/mL (ref 0.00–0.08)

## 2015-10-12 MED ORDER — ALBUTEROL SULFATE (2.5 MG/3ML) 0.083% IN NEBU
5.0000 mg | INHALATION_SOLUTION | Freq: Once | RESPIRATORY_TRACT | Status: AC
  Administered 2015-10-12: 5 mg via RESPIRATORY_TRACT

## 2015-10-12 MED ORDER — ALBUTEROL SULFATE (2.5 MG/3ML) 0.083% IN NEBU
INHALATION_SOLUTION | RESPIRATORY_TRACT | Status: AC
  Filled 2015-10-12: qty 6

## 2015-10-12 NOTE — ED Triage Notes (Signed)
Pt. reports progressing SOB with central chest pressure onset last Sunday , denies cough , no fever or chills.

## 2015-10-13 ENCOUNTER — Emergency Department (HOSPITAL_COMMUNITY): Payer: BLUE CROSS/BLUE SHIELD

## 2015-10-13 ENCOUNTER — Encounter (HOSPITAL_COMMUNITY): Payer: Self-pay | Admitting: Radiology

## 2015-10-13 LAB — BASIC METABOLIC PANEL
ANION GAP: 10 (ref 5–15)
BUN: 8 mg/dL (ref 6–20)
CHLORIDE: 99 mmol/L — AB (ref 101–111)
CO2: 27 mmol/L (ref 22–32)
Calcium: 9.7 mg/dL (ref 8.9–10.3)
Creatinine, Ser: 0.65 mg/dL (ref 0.44–1.00)
GFR calc Af Amer: >60 mL/min (ref >60)
GFR calc non Af Amer: >60 mL/min (ref >60)
GLUCOSE: 124 mg/dL — AB (ref 65–99)
POTASSIUM: 3.5 mmol/L (ref 3.5–5.1)
SODIUM: 136 mmol/L (ref 135–145)

## 2015-10-13 LAB — D-DIMER, QUANTITATIVE (NOT AT ARMC): D DIMER QUANT: 0.62 ug{FEU}/mL — AB (ref 0.00–0.50)

## 2015-10-13 MED ORDER — IOPAMIDOL (ISOVUE-370) INJECTION 76%
INTRAVENOUS | Status: AC
  Administered 2015-10-13: 100 mL
  Filled 2015-10-13: qty 100

## 2015-10-13 MED ORDER — HYDROCODONE-ACETAMINOPHEN 5-325 MG PO TABS
1.0000 | ORAL_TABLET | Freq: Once | ORAL | Status: AC
  Administered 2015-10-13: 1 via ORAL
  Filled 2015-10-13: qty 1

## 2015-10-13 MED ORDER — INDOMETHACIN 25 MG PO CAPS
50.0000 mg | ORAL_CAPSULE | Freq: Once | ORAL | Status: AC
  Administered 2015-10-13: 50 mg via ORAL
  Filled 2015-10-13: qty 2

## 2015-10-13 MED ORDER — INDOMETHACIN 50 MG PO CAPS: 50.0000 mg | ORAL_CAPSULE | Freq: Two times a day (BID) | ORAL | 0 refills | Status: DC

## 2015-10-13 MED ORDER — AZITHROMYCIN 250 MG PO TABS: 250.0000 mg | ORAL_TABLET | Freq: Every day | ORAL | 0 refills | Status: DC

## 2015-10-13 NOTE — ED Provider Notes (Signed)
[facility]-EMERGENCY DEPT Provider Note   CSN: [id number] Arrival date & time: 10/12/15  2301  By signing my name below, I, Allison Obrien, attest that this documentation has been prepared under the direction and in the presence of Shon Baton, MD . Electronically Signed: Christel Obrien, Scribe. 10/13/2015. 12:55 AM.    History   Chief Complaint Chief Complaint  Patient presents with  . Shortness of Breath    The history is provided by the patient. No language interpreter was used.   HPI Comments:  Allison Obrien is a 40 y.o. female with PMHx of esophagitis and pericarditis who presents to the Emergency Department wit multiple complaints. Pt complains of sudden onset, worsening shortness of breath and 10/10 chest pain x4 days. Pt reports that the chest pain is exacerbated when changing positions. Worse with laying flat. Pt further complains of associated headache, GERD, chills, and subjective fever. Pt notes that these symptoms are similar to her prior percarditis. Pt was seen at [facility] and was discharged. Patient reports that she has had worsening recent indigestion given that she has not been taking an enzyme that she takes for this.  Past Medical History:  Diagnosis Date  . Asthma   . Autoimmune disorder (HCC)   . Esophagitis   . Reflux     There are no active problems to display for this patient.   Past Surgical History:  Procedure Laterality Date  . CESAREAN SECTION    . NERVE BIOPSY    . SEPTOPLASTY    . TONSILLECTOMY      OB History    Gravida Para Term Preterm AB Living   1 0 0 0 0 0   SAB TAB Ectopic Multiple Live Births   0 0 0 0         Home Medications    Prior to Admission medications   Medication Sig Start Date End Date Taking? Authorizing Provider  azaTHIOprine (IMURAN) 50 MG tablet Take 100 mg by mouth daily.   Yes Historical Provider, MD  Biotin 10 MG TABS Take 10 mg by mouth daily.   Yes Historical Provider, MD    Cyanocobalamin (B-12) 5000 MCG CAPS Take 10,000 Units by mouth every other day.    Yes Historical Provider, MD  fluticasone (FLONASE) 50 MCG/ACT nasal spray Place 2 sprays into both nostrils daily.   Yes Historical Provider, MD  FOLIC ACID PO Take 1 tablet by mouth daily.   Yes Historical Provider, MD  ibuprofen (ADVIL,MOTRIN) 200 MG tablet Take 800 mg by mouth every 6 (six) hours as needed for moderate pain.   Yes Historical Provider, MD  lansoprazole (PREVACID) 30 MG capsule Take 30 mg by mouth 2 (two) times daily.    Yes Historical Provider, MD  omeprazole (PRILOSEC) 20 MG capsule Take 1 capsule (20 mg total) by mouth daily. 10/11/15  Yes Jeffrey Hedges, PA-C  predniSONE (DELTASONE) 5 MG tablet Take 5 mg by mouth daily with breakfast.  06/03/15  Yes Historical Provider, MD  azithromycin (ZITHROMAX) 250 MG tablet Take 1 tablet (250 mg total) by mouth daily. Take first 2 tablets together, then 1 every day until finished. 10/13/15   Shon Baton, MD  HYDROcodone-acetaminophen (NORCO/VICODIN) 5-325 MG per tablet Take 1 tablet by mouth every 4 (four) hours as needed. Patient not taking: Reported on 06/15/2015 07/24/14   Rolland Porter, MD  indomethacin (INDOCIN) 50 MG capsule Take 1 capsule (50 mg total) by mouth 2 (two) times daily  with a meal. 10/13/15   Shon Baton, MD  predniSONE (DELTASONE) 10 MG tablet Take 3 tablets (30 mg total) by mouth 2 (two) times daily with a meal. Patient not taking: Reported on 06/15/2015 07/24/14   Rolland Porter, MD  sucralfate (CARAFATE) 1 G tablet Take 1 tablet (1 g total) by mouth 4 (four) times daily. Patient not taking: Reported on 06/15/2015 07/24/14   Rolland Porter, MD    Family History Family History  Problem Relation Age of Onset  . Asthma Mother   . Thyroid disease Mother   . Hypertension Mother   . Anxiety disorder Mother   . Hyperlipidemia Mother   . Varicose Veins Mother   . Hypertension Father   . Asthma Sister   . Migraines Sister   . Hypertension  Sister   . Asthma Brother   . Thrombophlebitis Brother   . Hypertension Brother   . Diabetes Brother     Social History Social History  Substance Use Topics  . Smoking status: Never Smoker  . Smokeless tobacco: Never Used  . Alcohol use No     Allergies   Avelox [moxifloxacin hcl in nacl]; Benadryl [diphenhydramine]; Penicillins; and Zantac [ranitidine hcl]   Review of Systems Review of Systems  Constitutional: Positive for chills and fever.  Respiratory: Positive for shortness of breath. Negative for cough.   Cardiovascular: Positive for chest pain.  Neurological: Positive for headaches.  All other systems reviewed and are negative.    Physical Exam Updated Vital Signs BP 117/69   Pulse 92   Temp 99.2 F (37.3 C) (Oral)   Resp 17   LMP 10/05/2015 (Approximate)   SpO2 96%   Physical Exam  Constitutional: She is oriented to person, place, and time.  Obese  HENT:  Head: Normocephalic and atraumatic.  Eyes: Pupils are equal, round, and reactive to light.  Cardiovascular: Normal rate, regular rhythm and normal heart sounds.   No murmur heard. Pulmonary/Chest: Effort normal. No respiratory distress. She has no wheezes.  Abdominal: Soft. Bowel sounds are normal.  Neurological: She is alert and oriented to person, place, and time.  Skin: Skin is warm and dry. Rash noted.  Psychiatric: She has a normal mood and affect.  Nursing note and vitals reviewed.    ED Treatments / Results  DIAGNOSTIC STUDIES:  Oxygen Saturation is 98% on RA, normal by my interpretation.    COORDINATION OF CARE:  12:55 AM Discussed treatment plan with pt at bedside and pt agreed to plan.  Labs (all labs ordered are listed, but only abnormal results are displayed) Labs Reviewed  BASIC METABOLIC PANEL - Abnormal; Notable for the following:       Result Value   Chloride 99 (*)    Glucose, Bld 124 (*)    All other components within normal limits  CBC - Abnormal; Notable for the  following:    WBC 15.1 (*)    All other components within normal limits  D-DIMER, QUANTITATIVE (NOT AT [facility]) - Abnormal; Notable for the following:    D-Dimer, Quant 0.62 (*)    All other components within normal limits  I-STAT TROPOININ, ED    EKG  EKG Interpretation  Date/Time:  Wednesday October 12 2015 23:03:53 EDT Ventricular Rate:  123 PR Interval:  164 QRS Duration: 92 QT Interval:  316 QTC Calculation: 452 R Axis:   17 Text Interpretation:  Sinus tachycardia Biatrial enlargement T wave abnormality, consider inferior ischemia Abnormal ECG Confirmed by Kelilah Hebard  MD, Khi Mcmillen (  [id number]) on 10/13/2015 12:51:34 AM       Radiology Dg Chest 2 View  Result Date: 10/13/2015 CLINICAL DATA:  40 year old female with shortness of breath and chest pain EXAM: CHEST  2 VIEW COMPARISON:  Chest radiograph dated 10/11/2015 FINDINGS: Two views of the chest demonstrate mild eventration of the left hemidiaphragm. There is associated subsegmental atelectatic changes of the left lung base. Superimposed pneumonia is not excluded. The right lung is clear. There is no pleural effusion or pneumothorax. The cardiac silhouette is within normal limits. No acute osseous pathology. IMPRESSION: Left lung base subsegmental atelectatic changes versus less likely infiltrate. Clinical correlation is recommended. Electronically Signed   By: Elgie CollardArash  Radparvar M.D.   On: 10/13/2015 00:06   Dg Chest 2 View  Result Date: 10/11/2015 CLINICAL DATA:  Chest pain, shortness of breath. EXAM: CHEST  2 VIEW COMPARISON:  Radiographs of Jun 15, 2015. FINDINGS: The heart size and mediastinal contours are within normal limits. Both lungs are clear. No pneumothorax or pleural effusion is noted. The visualized skeletal structures are unremarkable. IMPRESSION: No active cardiopulmonary disease. Electronically Signed   By: Lupita RaiderJames  Green Jr, M.D.   On: 10/11/2015 19:30   Ct Angio Chest Pe W And/or Wo Contrast  Result Date: 10/13/2015 CLINICAL  DATA:  Acute onset of worsening shortness of breath and central chest pressure. Initial encounter. EXAM: CT ANGIOGRAPHY CHEST WITH CONTRAST TECHNIQUE: Multidetector CT imaging of the chest was performed using the standard protocol during bolus administration of intravenous contrast. Multiplanar CT image reconstructions and MIPs were obtained to evaluate the vascular anatomy. CONTRAST:  100 mL of Isovue 370 IV contrast COMPARISON:  Chest radiograph performed 10/12/2015, and CTA of the chest performed 06/15/2015 FINDINGS: There is no evidence of significant pulmonary embolus. Bibasilar airspace opacities, left greater than right, raise concern for multifocal pneumonia. There is no evidence of significant focal consolidation, pleural effusion or pneumothorax. No masses are identified; no abnormal focal contrast enhancement is seen. Trace pericardial fluid likely remains within normal limits. No mediastinal lymphadenopathy is seen. The great vessels are grossly unremarkable in appearance. No axillary lymphadenopathy is seen. The visualized portions of the thyroid gland are unremarkable in appearance. The visualized portions of the liver and spleen are unremarkable. The visualized portions of the pancreas, gallbladder, stomach, adrenal glands and kidneys are within normal limits. No acute osseous abnormalities are seen. A vertebral body hemangioma is noted at T7. Review of the MIP images confirms the above findings. IMPRESSION: 1. No evidence of significant pulmonary embolus. 2. Bibasilar airspace opacities, left greater than right, raise concern for multifocal pneumonia. 3. 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 Electronically Signed   By: Roanna RaiderJeffery  Chang M.D.   On: 10/13/2015 03:18    Procedures Procedures (including critical care time)    EMERGENCY DEPARTMENT US CARDIAC EXAM "Study: Limited Ultrasound of the heart and pericardium"  INDICATIONS:Tachycardia Multiple views of the heart and pericardium are obtained  with a multi-frequency probe.  PERFORMED UE:AVWUJWBY:Myself  IMAGES ARCHIVED?: Yes  FINDINGS: No pericardial effusion  LIMITATIONS:  Body habitus  VIEWS USED: Parasternal long axis  INTERPRETATION: Pericardial effusioin absent  COMMENT:  On limited study, no pericardial effusion noted, good squeeze   Medications Ordered in ED Medications  albuterol (PROVENTIL) (2.5 MG/3ML) 0.083% nebulizer solution 5 mg (5 mg Nebulization Given 10/12/15 2313)  indomethacin (INDOCIN) capsule 50 mg (50 mg Oral Given 10/13/15 0104)  HYDROcodone-acetaminophen (NORCO/VICODIN) 5-325 MG per tablet 1 tablet (1 tablet Oral Given 10/13/15 0104)  iopamidol (ISOVUE-370) 76 %  injection (100 mLs  Contrast Given 10/13/15 0228)     Initial Impression / Assessment and Plan / ED Course  I have reviewed the triage vital signs and the nursing notes.  Pertinent labs & imaging results that were available during my care of the patient were reviewed by me and considered in my medical decision making (see chart for details).  Clinical Course    Patient presents with chest pain and shortness of breath. Also reports chills and worsening indigestion. There are components of her story that are consistent with her prior pericarditis. She has positional chest discomfort. No murmur. No evidence of large pleural effusion on ultrasound. She was noted to be tachycardic initially. Afebrile. Initial lab work is notable for leukocytosis and a positive d-dimer. CT angios negative for pulmonary embolism. However, suggestive for possible pneumonia. Patient does report chills and has leukocytosis. On recheck, patient's chest pain improved with indomethacin. She likely needs formal echocardiogram. She has multiple antibiotic allergies including penicillins and fluoroquinolones. For this reason, she will be placed on azithromycin to cover for pneumonia. She will also be placed on a course of indomethacin and I have encouraged her to follow-up with her  cardiologist for formal echocardiogram. Patient stated understanding.  After history, exam, and medical workup I feel the patient has been appropriately medically screened and is safe for discharge home. Pertinent diagnoses were discussed with the patient. Patient was given return precautions.   Final Clinical Impressions(s) / ED Diagnoses   Final diagnoses:  Community acquired pneumonia  History of pericarditis    New Prescriptions New Prescriptions   AZITHROMYCIN (ZITHROMAX) 250 MG TABLET    Take 1 tablet (250 mg total) by mouth daily. Take first 2 tablets together, then 1 every day until finished.   INDOMETHACIN (INDOCIN) 50 MG CAPSULE    Take 1 capsule (50 mg total) by mouth 2 (two) times daily with a meal.   I personally performed the services described in this documentation, which was scribed in my presence. The recorded information has been reviewed and is accurate.     Shon Baton, MD 10/13/15 [phone]

## 2015-10-13 NOTE — Discharge Instructions (Signed)
You were seen today for chest pain and shortness of breath. He did not have any large effusion on bedside ultrasound. However, given your history of pericarditis, he will be treated with indomethacin. You also had some evidence of possible pneumonia on imaging.[facility] [place].  Givenher allergies, you were placed on azithromycin. If you have worsening symptoms she needs to be reevaluated immediately.

## 2015-10-13 NOTE — ED Notes (Signed)
Unable to initiate IV x1.  2nd RN to initiate at this time.

## 2015-10-13 NOTE — ED Notes (Signed)
Patient taken to CT.

## 2015-10-13 NOTE — ED Notes (Signed)
Patient returned from CT

## 2015-10-17 ENCOUNTER — Emergency Department (HOSPITAL_COMMUNITY): Payer: BLUE CROSS/BLUE SHIELD

## 2015-10-17 ENCOUNTER — Emergency Department (HOSPITAL_COMMUNITY)
Admission: EM | Admit: 2015-10-17 | Discharge: 2015-10-17 | Disposition: A | Discharge status: Home / Self Care | Payer: BLUE CROSS/BLUE SHIELD | Attending: Emergency Medicine | Admitting: Emergency Medicine

## 2015-10-17 ENCOUNTER — Encounter (HOSPITAL_COMMUNITY): Payer: Self-pay | Admitting: Emergency Medicine

## 2015-10-17 DIAGNOSIS — E119 Type 2 diabetes mellitus without complications: Secondary | ICD-10-CM | POA: Diagnosis not present

## 2015-10-17 DIAGNOSIS — R0602 Shortness of breath: Secondary | ICD-10-CM

## 2015-10-17 DIAGNOSIS — I10 Essential (primary) hypertension: Secondary | ICD-10-CM | POA: Diagnosis not present

## 2015-10-17 DIAGNOSIS — J45909 Unspecified asthma, uncomplicated: Secondary | ICD-10-CM | POA: Diagnosis not present

## 2015-10-17 DIAGNOSIS — R079 Chest pain, unspecified: Secondary | ICD-10-CM

## 2015-10-17 LAB — I-STAT TROPONIN, ED
TROPONIN I, POC: 0.03 ng/mL (ref 0.00–0.08)
TROPONIN I, POC: 0.02 ng/mL (ref 0.00–0.08)

## 2015-10-17 LAB — COMPREHENSIVE METABOLIC PANEL
ALT: 57 U/L — ABNORMAL HIGH (ref 14–54)
AST: 63 U/L — ABNORMAL HIGH (ref 15–41)
Albumin: 3.2 g/dL — ABNORMAL LOW (ref 3.5–5.0)
Alkaline Phosphatase: 112 U/L (ref 38–126)
Anion gap: 7 (ref 5–15)
BUN: 8 mg/dL (ref 6–20)
CHLORIDE: 102 mmol/L (ref 101–111)
CO2: 27 mmol/L (ref 22–32)
Calcium: 9 mg/dL (ref 8.9–10.3)
Creatinine, Ser: 0.61 mg/dL (ref 0.44–1.00)
Glucose, Bld: 93 mg/dL (ref 65–99)
POTASSIUM: 3.8 mmol/L (ref 3.5–5.1)
Sodium: 136 mmol/L (ref 135–145)
Total Bilirubin: 0.5 mg/dL (ref 0.3–1.2)
Total Protein: 6.8 g/dL (ref 6.5–8.1)

## 2015-10-17 LAB — CBC
HCT: 36.2 % (ref 36.0–46.0)
Hemoglobin: 11.5 g/dL — ABNORMAL LOW (ref 12.0–15.0)
MCH: 29.4 pg (ref 26.0–34.0)
MCHC: 31.8 g/dL (ref 30.0–36.0)
MCV: 92.6 fL (ref 78.0–100.0)
PLATELETS: 393 10*3/uL (ref 150–400)
RBC: 3.91 MIL/uL (ref 3.87–5.11)
RDW: 13.6 % (ref 11.5–15.5)
WBC: 10.9 10*3/uL — AB (ref 4.0–10.5)

## 2015-10-17 LAB — I-STAT BETA HCG BLOOD, ED (MC, WL, AP ONLY)

## 2015-10-17 MED ORDER — HYDROCODONE-ACETAMINOPHEN 5-325 MG PO TABS
2.0000 | ORAL_TABLET | Freq: Once | ORAL | Status: AC
  Administered 2015-10-17: 2 via ORAL
  Filled 2015-10-17: qty 2

## 2015-10-17 MED ORDER — HYDROCODONE-ACETAMINOPHEN 5-325 MG PO TABS: 1.0000 | ORAL_TABLET | Freq: Four times a day (QID) | ORAL | 0 refills | Status: DC | PRN

## 2015-10-17 MED ORDER — COLCHICINE 0.6 MG PO TABS: 0.6000 mg | ORAL_TABLET | Freq: Two times a day (BID) | ORAL | 0 refills | Status: DC

## 2015-10-17 NOTE — ED Provider Notes (Signed)
[facility]-EMERGENCY DEPT Provider Note   CSN: [id number] Arrival date & time: 10/17/15  0607     History   Chief Complaint Chief Complaint  Patient presents with  . Shortness of Breath  . Chest Pain    HPI Allison Obrien is a 40 y.o. female.  40yo F w/ PMH including Churg Strauss, HTN, T2DM, pericarditis, anxiety who p/w CP and SOB. She has had at least 1 week of SOB as well as sharp, L sided chest pain that is at times severe and worse lying flat, better sitting up. She was evaluated here twice for these symptoms and her workup has showed negative troponins, CTA of chest that was negative for PE but showed possible infiltrates. She was given a course of azithromycin but states that she took azithromycin that she had at home which was only a 3 day course of 500 mg. She was also sent home with indomethacin to treat pericarditis but she stopped taking it because she was waking up with sweats and chills and saw that this could be a medication side effect. She reports that the chest pain became severe last night at 11 PM and she has tingling and pain down her left arm with hand numbness. No vomiting, diarrhea, or measured fevers. She has an appointment with her pulmonologist tomorrow. She has not seen a cardiologist anytime recently.   The history is provided by the patient.  Shortness of Breath  Associated symptoms include chest pain.  Chest Pain   Associated symptoms include shortness of breath.    Past Medical History:  Diagnosis Date  . Asthma   . Autoimmune disorder (HCC)   . Esophagitis   . Reflux     There are no active problems to display for this patient.   Past Surgical History:  Procedure Laterality Date  . CESAREAN SECTION    . NERVE BIOPSY    . SEPTOPLASTY    . TONSILLECTOMY      OB History    Gravida Para Term Preterm AB Living   1 0 0 0 0 0   SAB TAB Ectopic Multiple Live Births   0 0 0 0         Home Medications    Prior to Admission  medications   Medication Sig Start Date End Date Taking? Authorizing Provider  azaTHIOprine (IMURAN) 50 MG tablet Take 100 mg by mouth daily.   Yes Historical Provider, MD  Biotin 10 MG TABS Take 10 mg by mouth daily.   Yes Historical Provider, MD  Cyanocobalamin (B-12) 5000 MCG CAPS Take 10,000 Units by mouth every other day.    Yes Historical Provider, MD  fluticasone (FLONASE) 50 MCG/ACT nasal spray Place 2 sprays into both nostrils daily.   Yes Historical Provider, MD  FOLIC ACID PO Take 1 tablet by mouth daily.   Yes Historical Provider, MD  ibuprofen (ADVIL,MOTRIN) 200 MG tablet Take 800 mg by mouth every 6 (six) hours as needed for moderate pain.   Yes Historical Provider, MD  indomethacin (INDOCIN) 50 MG capsule Take 1 capsule (50 mg total) by mouth 2 (two) times daily with a meal. 10/13/15  Yes Shon Baton, MD  lansoprazole (PREVACID) 30 MG capsule Take 30 mg by mouth 2 (two) times daily.    Yes Historical Provider, MD  predniSONE (DELTASONE) 5 MG tablet Take 5 mg by mouth daily with breakfast.  06/03/15  Yes Historical Provider, MD  colchicine 0.6 MG tablet Take 1 tablet (  0.6 mg total) by mouth 2 (two) times daily. 10/17/15   Laurence Spates, MD  HYDROcodone-acetaminophen (NORCO/VICODIN) 5-325 MG tablet Take 1-2 tablets by mouth every 6 (six) hours as needed. 10/17/15   Laurence Spates, MD  omeprazole (PRILOSEC) 20 MG capsule Take 1 capsule (20 mg total) by mouth daily. Patient not taking: Reported on 10/17/2015 10/11/15   Eyvonne Mechanic, PA-C  predniSONE (DELTASONE) 10 MG tablet Take 3 tablets (30 mg total) by mouth 2 (two) times daily with a meal. Patient not taking: Reported on 06/15/2015 07/24/14   Rolland Porter, MD    Family History Family History  Problem Relation Age of Onset  . Asthma Mother   . Thyroid disease Mother   . Hypertension Mother   . Anxiety disorder Mother   . Hyperlipidemia Mother   . Varicose Veins Mother   . Hypertension Father   . Asthma Sister   .  Migraines Sister   . Hypertension Sister   . Asthma Brother   . Thrombophlebitis Brother   . Hypertension Brother   . Diabetes Brother     Social History Social History  Substance Use Topics  . Smoking status: Never Smoker  . Smokeless tobacco: Never Used  . Alcohol use No     Allergies   Avelox [moxifloxacin hcl in nacl]; Benadryl [diphenhydramine]; Penicillins; and Zantac [ranitidine hcl]   Review of Systems Review of Systems  Respiratory: Positive for shortness of breath.   Cardiovascular: Positive for chest pain.   10 Systems reviewed and are negative for acute change except as noted in the HPI.   Physical Exam Updated Vital Signs BP 115/81   Pulse 103   Temp 98.2 F (36.8 C) (Oral)   Resp 26   Ht 5\' 9"  (1.753 m)   Wt 242 lb (109.8 kg)   LMP 10/05/2015 (Approximate)   SpO2 98%   BMI 35.74 kg/m   Physical Exam  Constitutional: She is oriented to person, place, and time. She appears well-developed and well-nourished. No distress.  anxious  HENT:  Head: Normocephalic and atraumatic.  Moist mucous membranes  Eyes: Conjunctivae are normal. Pupils are equal, round, and reactive to light.  Neck: Neck supple.  Cardiovascular: Normal rate, regular rhythm and normal heart sounds.   No murmur heard. Pulmonary/Chest: Effort normal and breath sounds normal.  Abdominal: Soft. Bowel sounds are normal. She exhibits no distension. There is no tenderness.  Musculoskeletal: She exhibits no edema.  Neurological: She is alert and oriented to person, place, and time.  Fluent speech  Skin: Skin is warm and dry.  Psychiatric: Judgment normal.  anxious  Nursing note and vitals reviewed.    ED Treatments / Results  Labs (all labs ordered are listed, but only abnormal results are displayed) Labs Reviewed  CBC - Abnormal; Notable for the following:       Result Value   WBC 10.9 (*)    Hemoglobin 11.5 (*)    All other components within normal limits  COMPREHENSIVE  METABOLIC PANEL - Abnormal; Notable for the following:    Albumin 3.2 (*)    AST 63 (*)    ALT 57 (*)    All other components within normal limits  I-STAT TROPOININ, ED  I-STAT BETA HCG BLOOD, ED ([facility], [facility], [facility] ONLY)  I-STAT TROPOININ, ED    EKG  EKG Interpretation  Date/Time:  Monday October 17 2015 06:18:55 EDT Ventricular Rate:  101 PR Interval:    QRS Duration: 88 QT Interval:  348  QTC Calculation: 452 R Axis:   29 Text Interpretation:  Sinus tachycardia Borderline T wave abnormalities Confirmed by Park[facility] Behavior[facility] Health SystemALUMBO-RASCH  MD, APRIL ([id number]) on 10/17/2015 6:28:58 AM       Radiology Dg Chest 2 View  Result Date: 10/17/2015 CLINICAL DATA:  Chest pain, shortness of Breath EXAM: CHEST  2 VIEW COMPARISON:  10/13/2015 FINDINGS: Cardiomediastinal silhouette is stable. There is streaky infiltrate/ pneumonia in left lower lobe retrocardiac. No pulmonary edema. Bony thorax is unremarkable. IMPRESSION: No pulmonary edema. Streaky infiltrate/ pneumonia in left lower lobe retrocardiac best seen on lateral view. Follow-up to resolution after appropriate treatment is recommended. Electronically Signed   By: Natasha MeadLiviu  Pop M.D.   On: 10/17/2015 10:14    Procedures Procedures (including critical care time)  Medications Ordered in ED Medications  HYDROcodone-acetaminophen (NORCO/VICODIN) 5-325 MG per tablet 2 tablet (2 tablets Oral Given 10/17/15 [id number])     Initial Impression / Assessment and Plan / ED Course  I have reviewed the triage vital signs and the nursing notes.  Pertinent labs & imaging results that were available during my care of the patient were reviewed by me and considered in my medical decision making (see chart for details).  Clinical Course   Pt w/ ongoing Left-sided chest pain and shortness of breath. She has had 2 ED visit evaluations which have shown no evidence of PE or ACS. She was anxious on exam but vital signs stable. No abnormal lung sounds. Afebrile. EKG similar to previous.  Her description of chest pain as better sitting up and worse laying flat, similar to her previous episode of pericarditis, suggests another episode of pericarditis. Bedside ultrasound a few days ago was negative for large effusion.  Her labs here including serial troponins are reassuring. Given that her symptoms have been constant for 1 week, I doubt ACS. She is afebrile with normal O2 saturation and reassuring pulmonary exam, therefore I will hold on any further antibiotic course and defer to her pulmonologist, who she will see tomorrow.  I have discussed the importance of follow-up with her specialists, pulmonology and cardiology, regarding ongoing treatment. I also reviewed return precautions. Discussed supportive care pericarditis including colchicine and Norco for breakthrough pain. Patient voiced understanding of plan and discharged in satisfactory condition.  Final Clinical Impressions(s) / ED Diagnoses   Final diagnoses:  Shortness of breath  Chest pain, unspecified chest pain type    New Prescriptions Discharge Medication List as of 10/17/2015  1:58 PM    START taking these medications   Details  colchicine 0.6 MG tablet Take 1 tablet (0.6 mg total) by mouth 2 (two) times daily., Starting Mon 10/17/2015, Print         Laurence Spatesachel Morgan Little, MD 10/17/15 [phone]

## 2015-10-17 NOTE — ED Triage Notes (Addendum)
Pt in from home with "sob x 1 wk". States sharp, L sided chest pain began last night at 11pm. Pt states pain is unrelieved by Tylenol. Hx of pericarditis, Churg Strauss syndrome and pleural effusions.Sats 98% on RA, alert, ambulatory, speaking in full sentences, very anxious

## 2016-02-02 ENCOUNTER — Emergency Department (HOSPITAL_COMMUNITY): Payer: BC Managed Care – PPO

## 2016-02-02 ENCOUNTER — Encounter (HOSPITAL_COMMUNITY): Payer: Self-pay | Admitting: *Deleted

## 2016-02-02 DIAGNOSIS — R0789 Other chest pain: Secondary | ICD-10-CM | POA: Insufficient documentation

## 2016-02-02 DIAGNOSIS — J45909 Unspecified asthma, uncomplicated: Secondary | ICD-10-CM | POA: Insufficient documentation

## 2016-02-02 DIAGNOSIS — R071 Chest pain on breathing: Secondary | ICD-10-CM | POA: Diagnosis present

## 2016-02-02 LAB — COMPREHENSIVE METABOLIC PANEL
ALBUMIN: 4 g/dL (ref 3.5–5.0)
ALK PHOS: 61 U/L (ref 38–126)
ALT: 20 U/L (ref 14–54)
ANION GAP: 9 (ref 5–15)
AST: 23 U/L (ref 15–41)
BUN: 9 mg/dL (ref 6–20)
CALCIUM: 9.3 mg/dL (ref 8.9–10.3)
CO2: 23 mmol/L (ref 22–32)
Chloride: 105 mmol/L (ref 101–111)
Creatinine, Ser: 0.73 mg/dL (ref 0.44–1.00)
GFR calc Af Amer: >60 mL/min (ref >60)
GFR calc non Af Amer: >60 mL/min (ref >60)
GLUCOSE: 132 mg/dL — AB (ref 65–99)
Potassium: 3.9 mmol/L (ref 3.5–5.1)
SODIUM: 137 mmol/L (ref 135–145)
Total Bilirubin: 0.4 mg/dL (ref 0.3–1.2)
Total Protein: 6.9 g/dL (ref 6.5–8.1)

## 2016-02-02 LAB — URINALYSIS, ROUTINE W REFLEX MICROSCOPIC
Bilirubin Urine: NEGATIVE
Glucose, UA: NEGATIVE mg/dL
KETONES UR: NEGATIVE mg/dL
LEUKOCYTES UA: NEGATIVE
Nitrite: NEGATIVE
PH: 5 (ref 5.0–8.0)
PROTEIN: NEGATIVE mg/dL
SQUAMOUS EPITHELIAL / LPF: NONE SEEN
Specific Gravity, Urine: 1.013 (ref 1.005–1.030)

## 2016-02-02 LAB — CBC WITH DIFFERENTIAL/PLATELET
BASOS PCT: 0 %
Basophils Absolute: 0 10*3/uL (ref 0.0–0.1)
EOS ABS: 0.2 10*3/uL (ref 0.0–0.7)
Eosinophils Relative: 2 %
HCT: 39.4 % (ref 36.0–46.0)
HEMOGLOBIN: 12.6 g/dL (ref 12.0–15.0)
Lymphocytes Relative: 16 %
Lymphs Abs: 1.4 10*3/uL (ref 0.7–4.0)
MCH: 27.6 pg (ref 26.0–34.0)
MCHC: 32 g/dL (ref 30.0–36.0)
MCV: 86.2 fL (ref 78.0–100.0)
Monocytes Absolute: 0.9 10*3/uL (ref 0.1–1.0)
Monocytes Relative: 10 %
NEUTROS PCT: 72 %
Neutro Abs: 6.1 10*3/uL (ref 1.7–7.7)
Platelets: 364 10*3/uL (ref 150–400)
RBC: 4.57 MIL/uL (ref 3.87–5.11)
RDW: 20.6 % — ABNORMAL HIGH (ref 11.5–15.5)
WBC: 8.6 10*3/uL (ref 4.0–10.5)

## 2016-02-02 LAB — I-STAT TROPONIN, ED: Troponin i, poc: 0 ng/mL (ref 0.00–0.08)

## 2016-02-02 NOTE — ED Triage Notes (Signed)
Patient presents with c/o chest discomfort with deeps breaths.  States she has an autoimmune disease and when this happens she is having a flair up

## 2016-02-02 NOTE — ED Notes (Signed)
Name called for vital sign reassessment. No answer. Was told by others in waiting room pt may possibly [place] food [place] [employer].

## 2016-02-03 ENCOUNTER — Emergency Department (HOSPITAL_COMMUNITY)
Admission: EM | Admit: 2016-02-03 | Discharge: 2016-02-03 | Disposition: A | Discharge status: Home / Self Care | Payer: BC Managed Care – PPO | Attending: Emergency Medicine | Admitting: Emergency Medicine

## 2016-02-03 DIAGNOSIS — R079 Chest pain, unspecified: Secondary | ICD-10-CM

## 2016-02-03 LAB — I-STAT TROPONIN, ED: TROPONIN I, POC: 0 ng/mL (ref 0.00–0.08)

## 2016-02-03 LAB — D-DIMER, QUANTITATIVE (NOT AT ARMC): D DIMER QUANT: 0.33 ug{FEU}/mL (ref 0.00–0.50)

## 2016-02-03 MED ORDER — IBUPROFEN 800 MG PO TABS: 800.0000 mg | ORAL_TABLET | Freq: Three times a day (TID) | ORAL | 0 refills | Status: AC

## 2016-02-03 MED ORDER — PREDNISONE 10 MG (21) PO TBPK: 10.0000 mg | ORAL_TABLET | Freq: Every day | ORAL | 0 refills | Status: DC

## 2016-02-03 MED ORDER — METHYLPREDNISOLONE SODIUM SUCC 125 MG IJ SOLR
125.0000 mg | Freq: Once | INTRAMUSCULAR | Status: AC
  Administered 2016-02-03: 125 mg via INTRAVENOUS
  Filled 2016-02-03: qty 2

## 2016-02-03 MED ORDER — KETOROLAC TROMETHAMINE 30 MG/ML IJ SOLN
30.0000 mg | Freq: Once | INTRAMUSCULAR | Status: AC
  Administered 2016-02-03: 30 mg via INTRAVENOUS
  Filled 2016-02-03: qty 1

## 2016-02-03 MED ORDER — HYDROCODONE-ACETAMINOPHEN 5-325 MG PO TABS: 1.0000 | ORAL_TABLET | Freq: Four times a day (QID) | ORAL | 0 refills | Status: DC | PRN

## 2016-02-03 NOTE — Discharge Instructions (Signed)
Please start her steroid taper in the morning. Please take all of this medication until complete and then resume your 5 mg once a day. Please follow-up with your rheumatologist. You had negative cardiac workup today, clear chest x-ray and normal vital signs. Please take ibuprofen 800 mg every 8 hours with food for the next week.

## 2016-02-03 NOTE — ED Notes (Addendum)
Pt states that she is having pain and pressure in her chest when she lays down that started on Saturday. Pt states she increased her prednisone by 10 mg for a total of 15 mg which usually helps her. Pt states she called her rheumatologist and they told her to come to the hospital this afternoon however she could not come in this afternoon, thus she is here this morning.   Pt states the pain comes on when she breaths and moves.

## 2016-02-03 NOTE — ED Provider Notes (Signed)
By signing my name below, I, Modena Jansky, attest that this documentation has been prepared under the direction and in the presence of Kristen N Ward, DO . Electronically Signed: Modena Jansky, Scribe. 02/03/2016. 1:02 AM.  TIME SEEN: 1:02 AM  CHIEF COMPLAINT: Chest pain  HPI:  HPI Comments: Allison Obrien is a 40 y.o. 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 She states has been having pain when she lays down, palpate this area takes a deep breath. She recently had her dose of prednisone increased to 15 mg for the past 3 days. She is chronically on prednisone 5 mg in am you're on 100 mg daily. She has associated fatigue, shortness of breath. No fever or cough. No history of PE or DVT. No lower extremity swelling or pain. States she was told by her rheumatologist to come to the emergency department. States she has never had to have a thoracentesis or pericardiocentesis before.   Rheumatologist: Dr. Freida Busman, [facility]     ROS: See HPI Constitutional: no fever  Eyes: no drainage  ENT: no runny nose   Cardiovascular:  +chest pain  Resp: +  SOB  GI: no vomiting GU: no dysuria Integumentary: no rash  Allergy: no hives  Musculoskeletal: no leg swelling  Neurological: no slurred speech ROS otherwise negative  PAST MEDICAL HISTORY/PAST SURGICAL HISTORY:  Past Medical History:  Diagnosis Date  . Asthma   . Autoimmune disorder (HCC)   . Esophagitis   . Reflux     MEDICATIONS:  Prior to Admission medications   Medication Sig Start Date End Date Taking? Authorizing Provider  azaTHIOprine (IMURAN) 50 MG tablet Take 100 mg by mouth daily.    Historical Provider, MD  Biotin 10 MG TABS Take 10 mg by mouth daily.    Historical Provider, MD  colchicine 0.6 MG tablet Take 1 tablet (0.6 mg total) by mouth 2 (two)  times daily. 10/17/15   Laurence Spates, MD  Cyanocobalamin (B-12) 5000 MCG CAPS Take 10,000 Units by mouth every other day.     Historical Provider, MD  fluticasone (FLONASE) 50 MCG/ACT nasal spray Place 2 sprays into both nostrils daily.    Historical Provider, MD  FOLIC ACID PO Take 1 tablet by mouth daily.    Historical Provider, MD  HYDROcodone-acetaminophen (NORCO/VICODIN) 5-325 MG tablet Take 1-2 tablets by mouth every 6 (six) hours as needed. 10/17/15   Laurence Spates, MD  ibuprofen (ADVIL,MOTRIN) 200 MG tablet Take 800 mg by mouth every 6 (six) hours as needed for moderate pain.    Historical Provider, MD  indomethacin (INDOCIN) 50 MG capsule Take 1 capsule (50 mg total) by mouth 2 (two) times daily with a meal. 10/13/15   Shon Baton, MD  lansoprazole (PREVACID) 30 MG capsule Take 30 mg by mouth 2 (two) times daily.     Historical Provider, MD  omeprazole (PRILOSEC) 20 MG capsule Take 1 capsule (20 mg total) by mouth daily. Patient not taking: Reported on 10/17/2015 10/11/15   Eyvonne Mechanic, PA-C  predniSONE (DELTASONE) 10 MG tablet Take 3 tablets (30 mg total) by mouth 2 (two) times daily with a meal. Patient not taking: Reported on 06/15/2015 07/24/14   Rolland Porter, MD  predniSONE (DELTASONE) 5 MG tablet Take 5 mg by mouth daily with breakfast.  06/03/15   Historical Provider, MD  ALLERGIES:  Allergies  Allergen Reactions  . Avelox [Moxifloxacin Hcl In Nacl] Shortness Of Breath    Rash   . Benadryl [Diphenhydramine] Shortness Of Breath  . Penicillins Shortness Of Breath    Has patient had a PCN reaction causing immediate rash, facial/tongue/throat swelling, SOB or lightheadedness with hypotension: Yes Has patient had a PCN reaction causing severe rash involving mucus membranes or skin necrosis: Yes Has patient had a PCN reaction that required hospitalization Yes Has patient had a PCN reaction occurring within the last 10 years: No If all of the above answers are "NO",  then may proceed with Cephalosporin use.   Orpha Bur. Zantac [Ranitidine Hcl] Shortness Of Breath    SOCIAL HISTORY:  Social History  Substance Use Topics  . Smoking status: Never Smoker  . Smokeless tobacco: Never Used  . Alcohol use No    FAMILY HISTORY: Family History  Problem Relation Age of Onset  . Asthma Mother   . Thyroid disease Mother   . Hypertension Mother   . Anxiety disorder Mother   . Hyperlipidemia Mother   . Varicose Veins Mother   . Hypertension Father   . Asthma Sister   . Migraines Sister   . Hypertension Sister   . Asthma Brother   . Thrombophlebitis Brother   . Hypertension Brother   . Diabetes Brother     EXAM: BP 116/72 (BP Location: Right Arm)   Pulse 71   Temp 97.9 F (36.6 C) (Oral)   Resp 15   Ht 5\' 10"  (1.778 m)   Wt 247 lb 11.2 oz (112.4 kg)   LMP 01/17/2016 Comment: has IUD   SpO2 100%   BMI 35.54 kg/m  CONSTITUTIONAL: Alert and oriented and responds appropriately to questions. Well-appearing; well-nourished HEAD: Normocephalic EYES: Conjunctivae clear, PERRL, EOMI ENT: normal nose; no rhinorrhea; moist mucous membranes NECK: Supple, no meningismus, no nuchal rigidity, no LAD  CARD: RRR; S1 and S2 appreciated; no murmurs, no clicks, no rubs, no gallops  CHEST:  Tender to palpation over the left chest wall without crepitus, ecchymosis or deformity. RESP: Normal chest excursion without splinting or tachypnea; breath sounds clear and equal bilaterally; no wheezes, no rhonchi, no rales, no hypoxia or respiratory distress, speaking full sentences ABD/GI: Normal bowel sounds; non-distended; soft, non-tender, no rebound, no guarding, no peritoneal signs, no hepatosplenomegaly BACK:  The back appears normal and is non-tender to palpation, there is no CVA tenderness EXT: Normal ROM in all joints; non-tender to palpation; no edema; normal capillary refill; no cyanosis, no calf tenderness or swelling    SKIN: Normal color for age and race; warm; no  rash NEURO: Moves all extremities equally, sensation to light touch intact diffusely, cranial nerves II through XII intact, normal speech PSYCH: The patient's mood and manner are appropriate. Grooming and personal hygiene are appropriate.   MEDICAL DECISION MAKING: Patient here with chest pain. Has had similar symptoms before with her autoimmune disorder. Denies history of ACS, PE or DVT. First cardiac labs is negative. Chest x-ray shows no pleural effusion, cardiomegaly, edema. No signs of volume overload on exam. Plan is to repeat second troponin, d-dimer. Will treat symptomatically with Toradol, Solu-Medrol and reassess.  ED PROGRESS: 3:10 AM  Patient reports her pain has improved. A second troponin is negative. D-dimer negative. Hemodynamically stable. She feels this is similar to her autoimmune flares and pericarditis. We'll discharge home with prescription for ibuprofen to take for the next week and a steroid taper. We'll have her follow-up with  her rheumatologist. Will give cardiology follow-up as well for this area. Discussed return cautions. Patient verbalizes understanding and is comfort with this plan.   At this time, I do not feel there is any life-threatening condition present. I have reviewed and discussed all results (EKG, imaging, lab, urine as appropriate) and exam findings with patient/family. I have reviewed nursing notes and appropriate previous records.  I feel the patient is safe to be discharged home without further emergent workup and can continue workup as an outpatient as needed. Discussed usual and customary return precautions. Patient/family verbalize understanding and are comfortable with this plan.  Outpatient follow-up has been provided. All questions have been answered.     EKG Interpretation  Date/Time:  Thursday February 02 2016 19:25:43 EST Ventricular Rate:  75 PR Interval:  170 QRS Duration: 80 QT Interval:  376 QTC Calculation: 419 R Axis:   16 Text  Interpretation:  Normal sinus rhythm Possible Inferior infarct , age undetermined Cannot rule out Anterior infarct , age undetermined Abnormal ECG No significant change since last tracing Confirmed by WARD,  DO, KRISTEN [phone]([id number]) on 02/03/2016 1:00:22 AM         I personally performed the services described in this documentation, which was scribed in my presence. The recorded information has been reviewed and is accurate.     Layla MawKristen N Ward, DO 02/03/16 [phone]

## 2016-02-03 NOTE — ED Notes (Signed)
Pt came in room stating that she was told not to eat but she did eat and drink because she had to.

## 2016-12-20 ENCOUNTER — Telehealth: Payer: Self-pay | Admitting: *Deleted

## 2016-12-20 NOTE — Telephone Encounter (Signed)
Called and left a message with the new patient appt for December 10th at 9:45am

## 2017-01-14 ENCOUNTER — Ambulatory Visit: Payer: BC Managed Care – PPO | Admitting: Gynecology

## 2017-01-18 ENCOUNTER — Encounter: Payer: Self-pay | Admitting: Gynecology

## 2017-01-18 ENCOUNTER — Ambulatory Visit: Payer: BC Managed Care – PPO | Attending: Gynecology | Admitting: Gynecology

## 2017-01-18 ENCOUNTER — Encounter: Payer: Self-pay | Admitting: Gynecologic Oncology

## 2017-01-18 ENCOUNTER — Ambulatory Visit: Payer: BC Managed Care – PPO | Admitting: Gynecology

## 2017-01-18 VITALS — BP 127/28 | HR 78 | Temp 98.5°F | Resp 20 | Ht 70.0 in | Wt 254.9 lb

## 2017-01-18 DIAGNOSIS — Z888 Allergy status to other drugs, medicaments and biological substances status: Secondary | ICD-10-CM | POA: Insufficient documentation

## 2017-01-18 DIAGNOSIS — K219 Gastro-esophageal reflux disease without esophagitis: Secondary | ICD-10-CM | POA: Insufficient documentation

## 2017-01-18 DIAGNOSIS — Z818 Family history of other mental and behavioral disorders: Secondary | ICD-10-CM | POA: Insufficient documentation

## 2017-01-18 DIAGNOSIS — Z9889 Other specified postprocedural states: Secondary | ICD-10-CM | POA: Insufficient documentation

## 2017-01-18 DIAGNOSIS — Z833 Family history of diabetes mellitus: Secondary | ICD-10-CM | POA: Insufficient documentation

## 2017-01-18 DIAGNOSIS — Z820 Family history of epilepsy and other diseases of the nervous system: Secondary | ICD-10-CM | POA: Diagnosis not present

## 2017-01-18 DIAGNOSIS — L439 Lichen planus, unspecified: Secondary | ICD-10-CM

## 2017-01-18 DIAGNOSIS — J45909 Unspecified asthma, uncomplicated: Secondary | ICD-10-CM | POA: Insufficient documentation

## 2017-01-18 DIAGNOSIS — Z79899 Other long term (current) drug therapy: Secondary | ICD-10-CM | POA: Diagnosis not present

## 2017-01-18 DIAGNOSIS — Z8249 Family history of ischemic heart disease and other diseases of the circulatory system: Secondary | ICD-10-CM | POA: Insufficient documentation

## 2017-01-18 DIAGNOSIS — Z825 Family history of asthma and other chronic lower respiratory diseases: Secondary | ICD-10-CM | POA: Insufficient documentation

## 2017-01-18 DIAGNOSIS — Z880 Allergy status to penicillin: Secondary | ICD-10-CM | POA: Diagnosis not present

## 2017-01-18 DIAGNOSIS — I776 Arteritis, unspecified: Secondary | ICD-10-CM | POA: Diagnosis not present

## 2017-01-18 DIAGNOSIS — N9089 Other specified noninflammatory disorders of vulva and perineum: Secondary | ICD-10-CM | POA: Diagnosis not present

## 2017-01-18 DIAGNOSIS — A630 Anogenital (venereal) warts: Secondary | ICD-10-CM

## 2017-01-18 NOTE — Progress Notes (Signed)
Consult Note: Gyn-Onc   Allison Obrien 41 y.o. female  Chief Complaint  Patient presents with  . Condyloma    Assessment : Multiple isolated vulvar lesions pending biopsy report.  Plan: We will await biopsy report before making any further recommendations.    HPI: 41 year old seen in consultation at the request of Dr. Marline BackboneVernon Stringer regarding management of vulvar lesions.  The patient reports that approximately a year ago she developed a isolated vulvar lesion.  This went untreated and over time several other lesions developed on her vulva and mons.  She was seen by Dr. Stefano GaulStringer who recommended treatment with imiqumod5% topical cream.  The patient applied it on 3 but developed skin irritation and discontinued its use.  In the interval, she acquired a "miracle oil" from a friend.  She applied this miracle oil twice a day for approximately a month and noted resolution of a number of the lesions.  The patient comes with photodocumentation showing before and after results which are quite significantly different.  Patient reports that these lesions are somewhat pruritic and slightly painful.  The patient has a vasculitis and is currently being treated with prednisone and Imuran.   Review of Systems:10 point review of systems is negative except as noted in interval history.   Vitals: Blood pressure (!) 127/28, pulse 78, temperature 98.5 F (36.9 C), temperature source Oral, resp. rate 20, height 5\' 10"  (1.778 m), weight 254 lb 14.4 oz (115.6 kg), SpO2 100 %.  Physical Exam: General : The patient is a healthy woman in no acute distress.  HEENT: normocephalic, extraoccular movements normal; neck is supple without thyromegally  Lynphnodes: Supraclavicular and inguinal nodes not enlarged  Abdomen: Soft, non-tender, no ascites, no organomegally, no masses, no hernias  Pelvic:  EGBUS: Normal female.  There are several isolated raised lesions on the mons and labia majora that do not  appear typical for condylomata.  These are all approximately 5 mm in diameter that is slightly raised.  Lower extremities: No edema or varicosities. Normal range of motion  Procedure note: After obtaining verbal consent, the skin on the right labia majora was prepped with Betadine and local anesthesia achieved with 1% lidocaine.  A punch biopsy is obtained of one lesion.  Hemostasis achieved with silver nitrate.  Patient tolerated the procedure well.     Allergies  Allergen Reactions  . Avelox [Moxifloxacin Hcl In Nacl] Shortness Of Breath    Rash   . Benadryl [Diphenhydramine] Shortness Of Breath  . Penicillins Shortness Of Breath    Has patient had a PCN reaction causing immediate rash, facial/tongue/throat swelling, SOB or lightheadedness with hypotension: Yes Has patient had a PCN reaction causing severe rash involving mucus membranes or skin necrosis: Yes Has patient had a PCN reaction that required hospitalization Yes Has patient had a PCN reaction occurring within the last 10 years: No If all of the above answers are "NO", then may proceed with Cephalosporin use.   . Zantac [Ranitidine Hcl] Shortness Of Breath    Past Medical History:  Diagnosis Date  . Asthma   . Autoimmune disorder (HCC)   . Esophagitis   . Reflux     Past Surgical History:  Procedure Laterality Date  . CESAREAN SECTION    . NERVE BIOPSY    . SEPTOPLASTY    . TONSILLECTOMY      Current Outpatient Medications  Medication Sig Dispense Refill  . albuterol (PROVENTIL HFA;VENTOLIN HFA) 108 (90 Base) MCG/ACT inhaler Inhale  1-2 puffs into the lungs every 6 (six) hours as needed for wheezing or shortness of breath.    . azaTHIOprine (IMURAN) 50 MG tablet Take 100 mg by mouth daily.    . budesonide-formoterol (SYMBICORT) 160-4.5 MCG/ACT inhaler Inhale 2 puffs into the lungs 2 (two) times daily.    Marland Kitchen. ibuprofen (ADVIL,MOTRIN) 800 MG tablet Take 1 tablet (800 mg total) by mouth every 8 (eight) hours. 30  tablet 0  . lansoprazole (PREVACID) 30 MG capsule Take 30 mg by mouth 2 (two) times daily as needed (for acid reflux).     . predniSONE (DELTASONE) 5 MG tablet prednisone 5 mg tablet    . sulfamethoxazole-trimethoprim (BACTRIM,SEPTRA) 400-80 MG tablet Take 1 tablet by mouth daily.     No current facility-administered medications for this visit.     Social History   Socioeconomic History  . Marital status: Single    Spouse name: Not on file  . Number of children: Not on file  . Years of education: Not on file  . Highest education level: Not on file  Social Needs  . Financial resource strain: Not on file  . Food insecurity - worry: Not on file  . Food insecurity - inability: Not on file  . Transportation needs - medical: Not on file  . Transportation needs - non-medical: Not on file  Occupational History  . Not on file  Tobacco Use  . Smoking status: Never Smoker  . Smokeless tobacco: Never Used  Substance and Sexual Activity  . Alcohol use: No  . Drug use: No  . Sexual activity: Not Currently    Birth control/protection: None  Other Topics Concern  . Not on file  Social History Narrative  . Not on file    Family History  Problem Relation Age of Onset  . Asthma Mother   . Thyroid disease Mother   . Hypertension Mother   . Anxiety disorder Mother   . Hyperlipidemia Mother   . Varicose Veins Mother   . Hypertension Father   . Asthma Sister   . Migraines Sister   . Hypertension Sister   . Asthma Brother   . Thrombophlebitis Brother   . Hypertension Brother   . Diabetes Brother       De Blanchaniel Clarke-Pearson, MD 01/18/2017, 9:40 AM

## 2017-01-18 NOTE — Patient Instructions (Signed)
We will call you with the results of your biopsy from today.  Keep the area clean and dry.  Please call for any questions or concerns.

## 2017-01-18 NOTE — Addendum Note (Signed)
Addended by: Warner MccreedyROSS, MELISSA D on: 01/18/2017 11:11 AM   Modules accepted: Orders

## 2017-01-22 ENCOUNTER — Telehealth: Payer: Self-pay | Admitting: Gynecologic Oncology

## 2017-01-22 NOTE — Progress Notes (Signed)
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   I believe the patient would be best served by seeing a dermatologist for further evaluation (including checking Hepatitis C status). We will call the patient with this recommendation

## 2017-01-22 NOTE — Telephone Encounter (Signed)
Patient informed of biopsy results along with Dr. Nelwyn Salisburylarke-Pearson's recommendations for a dermatology evaluation.  Patient stating she has seen a dermatologist before and she will call back with the name.  All questions answered.  Advised to call for any needs.

## 2017-04-26 IMAGING — CR DG CHEST 2V
2 series · 2 of 2 positions shown · non-contrast
Comparison: 10/17/2015

CLINICAL DATA: Pt c/o central and left-sided chest pain and SOB x 4
days. Hx of asthma, pericarditis, and pleural effusion. Pt is a
nonsmoker. Pt has taken her breathing treatments but has not
experienced any relief.

EXAM:
CHEST  2 VIEW

[chest pa]
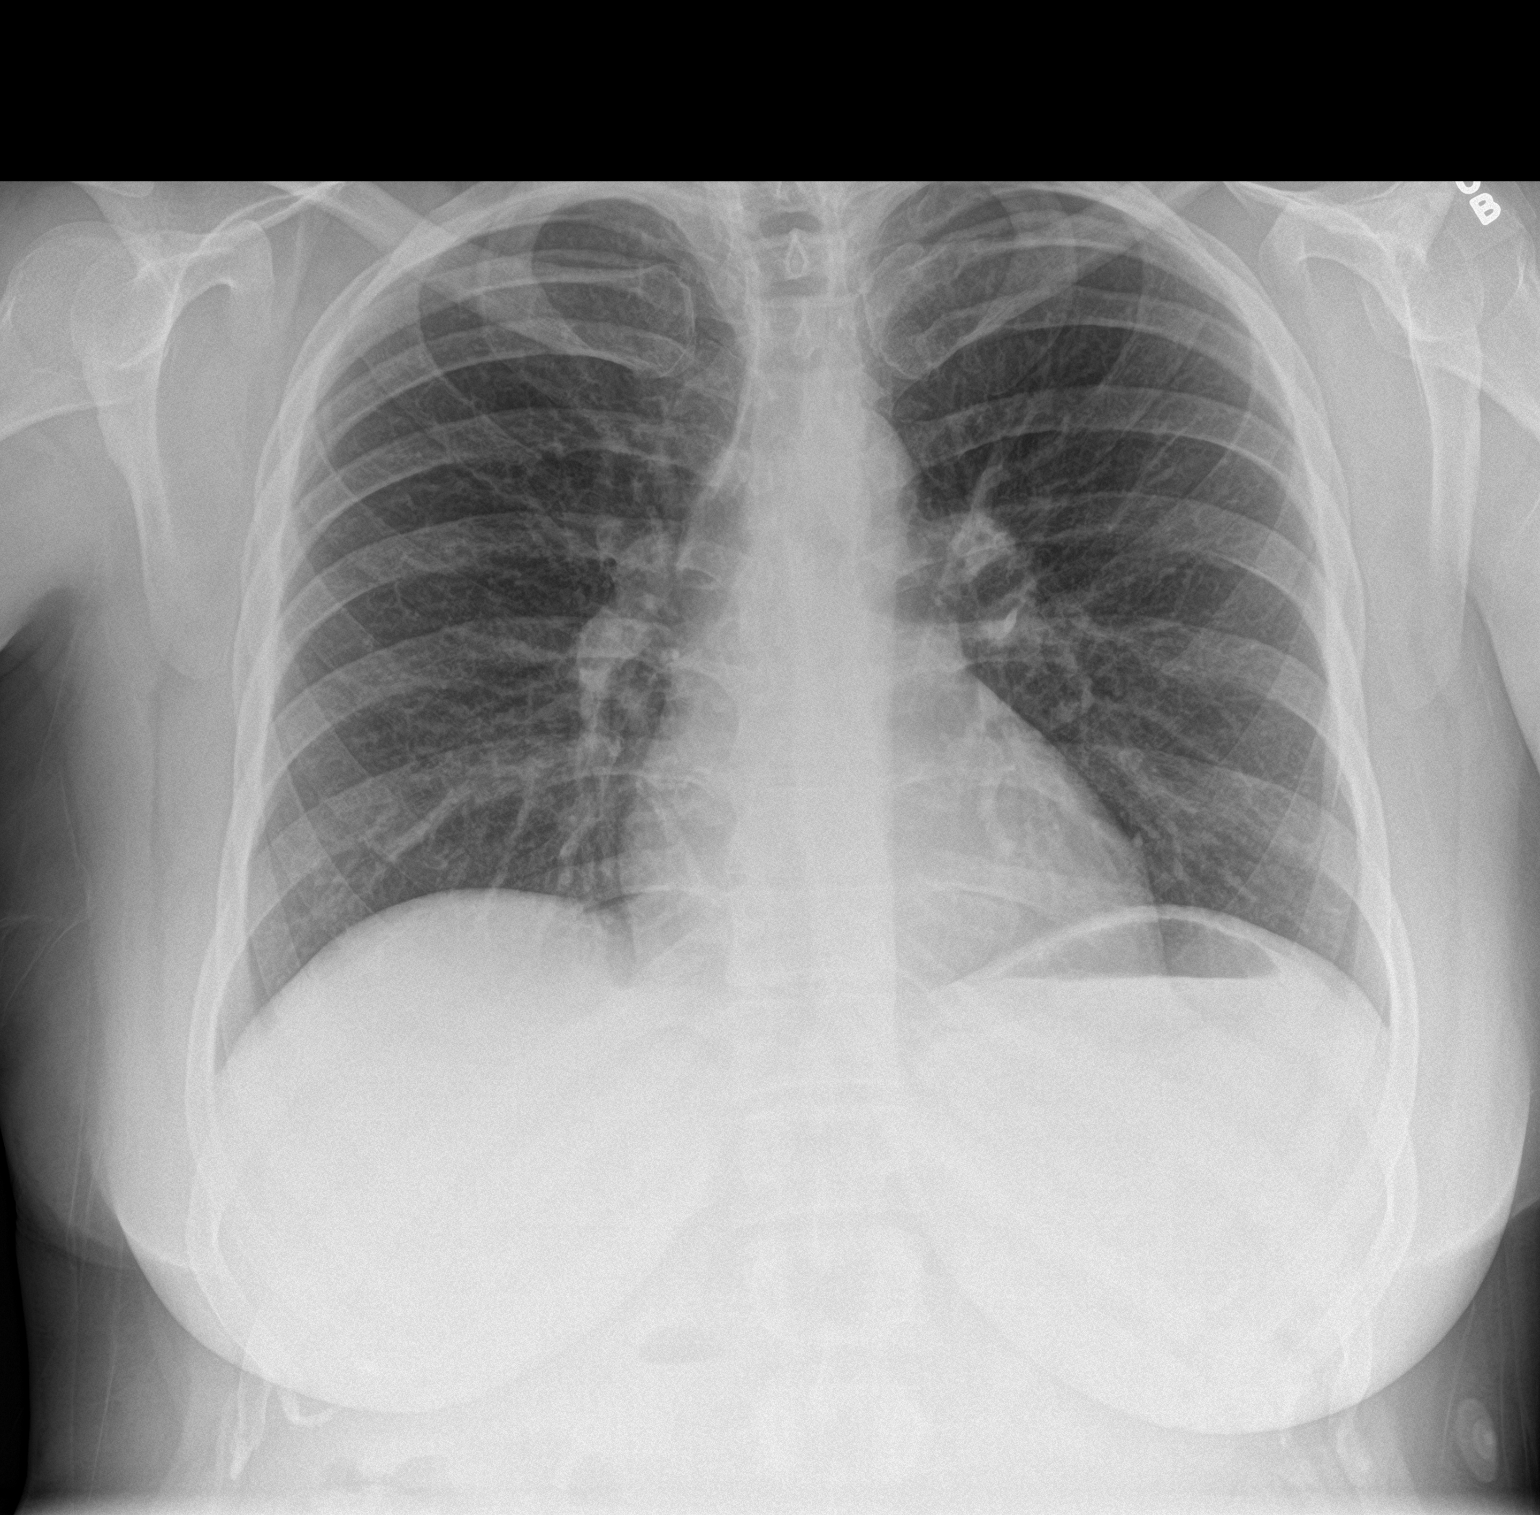

[chest lat]
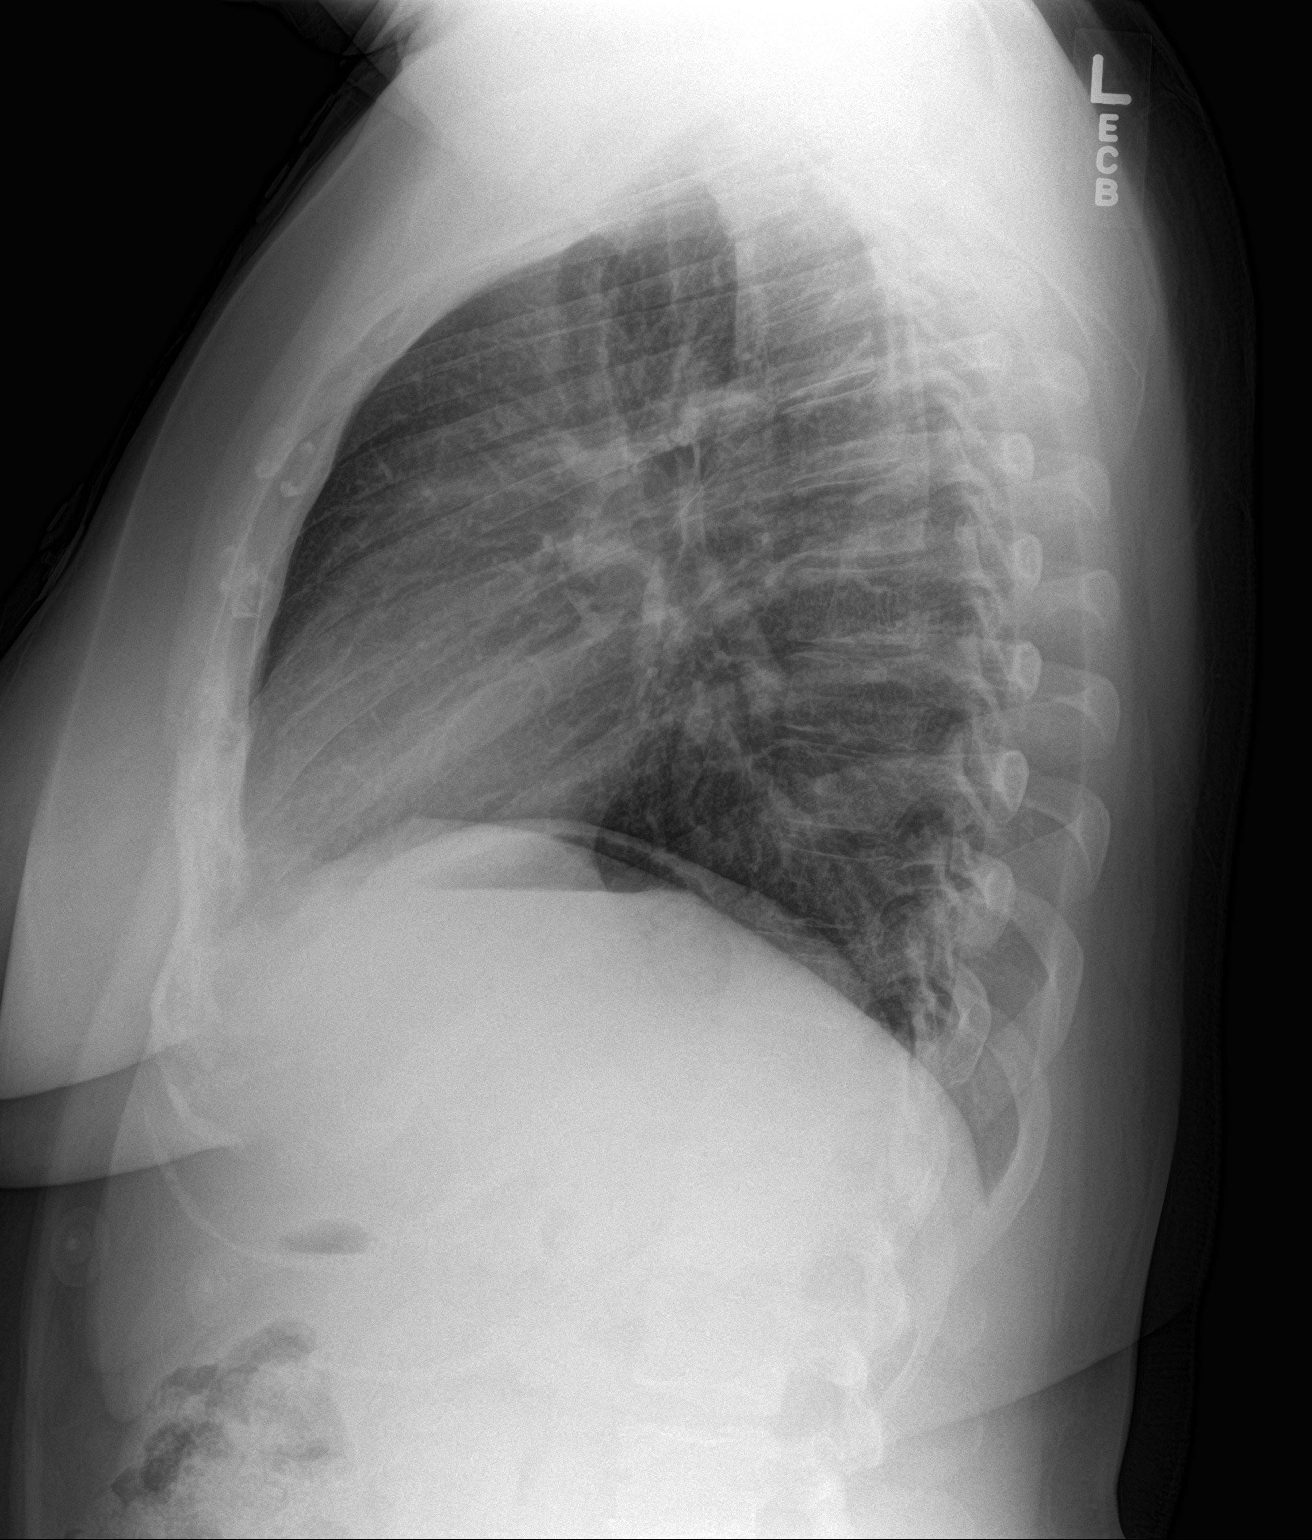

[2 of 2 positions shown; findings below may reference images not displayed]

FINDINGS: The heart size and mediastinal contours are within normal limits.
Both lungs are clear. No pleural effusion or pneumothorax. The
visualized skeletal structures are unremarkable.
IMPRESSION: No active cardiopulmonary disease.

## 2017-06-07 ENCOUNTER — Ambulatory Visit (HOSPITAL_COMMUNITY)
Admission: RE | Admit: 2017-06-07 | Discharge: 2017-06-07 | Disposition: A | Discharge status: Home / Self Care | Payer: BC Managed Care – PPO | Source: Ambulatory Visit | Attending: Obstetrics and Gynecology | Admitting: Obstetrics and Gynecology

## 2017-06-07 DIAGNOSIS — Z8279 Family history of other congenital malformations, deformations and chromosomal abnormalities: Secondary | ICD-10-CM | POA: Insufficient documentation

## 2017-06-07 DIAGNOSIS — Z315 Encounter for genetic counseling: Secondary | ICD-10-CM | POA: Insufficient documentation

## 2017-06-07 NOTE — Progress Notes (Signed)
Genetic Counseling  Preconception Note  Appointment Date:  06/07/2017 Referred By: Ena Dawley, MD Date of Birth:  01-Sep-1975   Pregnancy History: [id number] Attending: Viann Fish, MD    [name] was seen for preconception genetic counseling because of a maternal age of 42 y.o.. She also reported a personal history of Churg-Strauss syndrome, for which we discussed option of scheduled Maternal Fetal Medicine preconception consultation. [name] stated that she is not actively seeking pregnancy but is planning to marry in the near future and wants to gather information regarding potential implications of pregnancy given her medical history, should she and her partner elect to pursue pregnancy together.   In summary:  Discussed AMA and associated risk for fetal aneuploidy  Discussed options for aneuploidy screening post conception  First screen  Quad screen  NIPS for aneuploidy and for single gene conditions  Ultrasound  Discussed diagnostic testing options post conception in pregnancy  [facility]  Amniocentesis  Reviewed family history concerns  Patient expressed that her primary questions and concerns were related to potential future pregnancy and her personal history of Churg-Strauss syndrome  MFM consultation being scheduled for patient in the near future to better address these questions and concerns  Discussed carrier screening options- declined today but may pursue in the future  CF  SMA  Hemoglobinopathies  Expanded Carrier screening  The patient reported a 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 She reported that she was diagnosed in January 2016 in [place] and currently is followed by Rheumatology and Pulmonology at [facility]. She is treated currently with 3 medications. We discussed that MFM preconception consultation is available to  better review this history and discuss pregnancy management regarding this medical history. We discussed the option of scheduling [facility] or at [facility], given that the patient works in [place]. [name] stated that she preferred for MFM preconception consultation to be scheduled in the [facility] location given her work schedule and location. Given that MFM consultation is being planned regarding this indication, we deferred detailed discussion of personal and medication history at this time.   She was counseled regarding maternal age and the association with risk for chromosome conditions due to nondisjunction with aging of the ova.   We reviewed chromosomes, nondisjunction, and the associated 1 in 5 risk for fetal aneuploidy related to a maternal age of 42 y.o..  She was counseled that the risk for aneuploidy decreases during a pregnancy as gestational age increases, accounting for those pregnancies which spontaneously abort.  We specifically discussed Down syndrome (trisomy 85), trisomies 17 and 32, and sex chromosome aneuploidies (47,XXX and 47,XXY) including the common features and prognoses of each. The patient understands that screening and testing for these conditions would be available to her in a future pregnancy, post-conception.   We reviewed available screening options including First Screen, Quad screen, noninvasive prenatal screening (NIPS)/cell free DNA (cfDNA) screening, and detailed ultrasound.  She was counseled that screening tests are used to modify a patient's a priori risk for aneuploidy, typically based on age. This estimate provides a pregnancy specific risk assessment. We reviewed the benefits and limitations of each option. Specifically, we discussed the conditions for which each test screens, the detection rates, and false positive rates of each. She was also counseled regarding diagnostic  testing via CVS and amniocentesis. We reviewed the  associated risk for complications from CVS and amniocentesis, including spontaneous pregnancy loss. We discussed the possible results that the tests might provide including: positive, negative, unanticipated, and no result.   [name] was provided with written information regarding cystic fibrosis (CF), spinal muscular atrophy (SMA) and hemoglobinopathies including the carrier frequency, availability of carrier screening and prenatal diagnosis if indicated. We discussed that each person is estimated to have 7-10 genes that do not work correctly, meaning that each person is estimated to be a carrier of 7-10 different genetic conditions. They were counseled that the majority of these conditions follow autosomal recessive inheritance. We reviewed that we each have two copies of all of our genes, one inherited from each parent. If one copy of the pair of genes is changed in a way that causes it not to function properly, but the other copy of the gene works, then a person is called a "carrier" for that condition. However, because the other copy of the gene works correctly, the person typically does not have any health problems related to the non-working gene(s). It is only when BOTH copies of the pair of genes do not work correctly that a person will have the condition and the related health problems.   There are a myriad of genetic disorders that occur more frequently in specific ethnic groups, those which can be traced to particular geographic locations. We discussed that although these genetic disorders are much more prevalent in specific ethnic groups, as families are becoming increasingly multiracial and multicultural, these conditions can occur in anyone from any race or ethnicity. For this reason, we discussed the availability of ethnic specific genetic carrier screening, professional society (ACOG) recommended carrier testing, and  pan-ethnic carrier screening.   We reviewed that ACOG currently recommends that all patients be offered carrier screening for cystic fibrosis, spinal muscular atrophy and hemoglobinopathies. In addition, they were counseled that there are a variety of genetic screening laboratories that have pan-ethnic, or expanded, carrier screening panels, which evaluate carrier status for a wide range of genetic conditions. Some of these conditions are severe and actionable, but also rare; others occur more commonly, but are less severe. We discussed that testing options range from screening for a single condition to panels of more than 200 autosomal or X-linked genetic conditions. We reviewed that the prevalence of each condition varies (and often varies with ethnicity). Thus the couples' background risk to be a carrier for each of these various conditions would range, and in some cases be very low or unknown. Similarly, the detection rate varies with each condition and also varies in some cases with ethnicity, ranging from greater than 99% (in the case of hemoglobinopathies) to unknown. We reviewed that a negative carrier screen would thus reduce, but not eliminate the chance to be a carrier for these conditions. For some conditions included on specific pan-ethnic carrier screening panels, the pre-test carrier frequency and/or the detection rate is unknown. Thus, for some conditions, the exact reduction of risk with a negative carrier screening result may not be able to be quantified. We reviewed that in the event that one partner is found to be a carrier for one or more conditions, carrier screening would be available to the partner for those conditions. Ms Allison Obrien was provided with written information regarding pan-ethnic carrier screening option including a list of the conditions included on the screening panel. We discussed the risks, benefits, and limitations of carrier screening  with the couple. [name] declined expanded carrier screening at today's visit but will contact us, should she desire [place] to facilitate that testing.    Both family histories were reviewed and found to be contributory for a nephew to the patient with a hole in the heart, which resolved without required surgery. Her nephew is currently 76 years old and is healthy. We reviewed that congenital heart defects (CHDs) can be isolated or a feature of an underlying genetic condition. Congenital heart defects are most often multifactorial in etiology, but can also result from chromosome aberrations, single gene conditions, or teratogenic exposures. We discussed that isolated, nonsyndromic CHDs occur in approximately 1% of the general population. If [name]'s relative had an isolated CHD, the risk of recurrence would not be expected to be increased for a third degree relative. If however, her relative had an underlying genetic condition that caused the CHD, the risk of recurrence could be increased. In this case, the specific chance of recurrence depends on the inheritance of the condition. Without further information, an accurate risk assessment cannot be provided. [name] and her partner both reported [place] ancestry. Consanguinity was denied for the couple.   I counseled [name] regarding the above risks and available options.  The approximate face-to-face time with the genetic counselor was 45 minutes.  Chipper Oman, MS,  Certified Genetic Counselor 06/07/2017

## 2019-06-04 ENCOUNTER — Other Ambulatory Visit: Payer: Self-pay | Admitting: Obstetrics & Gynecology

## 2022-03-12 ENCOUNTER — Encounter: Payer: Self-pay | Admitting: Family Medicine

## 2022-03-12 ENCOUNTER — Ambulatory Visit
Admission: EM | Admit: 2022-03-12 | Discharge: 2022-03-12 | Disposition: A | Discharge status: Home / Self Care | Payer: Medicaid Other

## 2022-03-12 DIAGNOSIS — R030 Elevated blood-pressure reading, without diagnosis of hypertension: Secondary | ICD-10-CM | POA: Diagnosis not present

## 2022-03-12 NOTE — ED Triage Notes (Addendum)
Patient with c/o headache since she went to bed last night. States her left eye had some redness that is now improved. Patient states when she woke up this morning she felt "confused" as if something "happened and I didn't remember it happening". Stroke scale negative in triage.

## 2022-03-12 NOTE — ED Provider Notes (Signed)
[facility]    CSN: [id number] Arrival date & time: 03/12/22  1222      History   Chief Complaint Chief Complaint  Patient presents with   Hypertension    HPI Iris B Analysia Dungee is a 47 y.o. female.   HPI Very pleasant 47 year old female presents with headache since going to bed last night.  Reports that her left eye has had some redness now improved.  Patient reports wakes up this morning feeling confused like something happened but cannot remember anything happening.  Patient is concerned about ongoing elevated home blood pressure measurements.  Reports systolic BPs of 191Y and diastolic BP of 78-295.  Patient reports this is unusual for her.  PMH significant for morbid obesity, hypomagnesium, asthma, autoimmune disorder, and esophagitis.  Patient reports taking 2 OTC extra strength Excedrin yesterday for headache which her husband gave her that helped her with headache symptoms.  Past Medical History:  Diagnosis Date   Asthma    Autoimmune disorder ([place])    Esophagitis    Reflux     Patient Active Problem List   Diagnosis Date Noted   Encounter for procreative genetic counseling 06/07/2017   Family history of congenital anomaly 06/07/2017    Past Surgical History:  Procedure Laterality Date   CESAREAN SECTION     NERVE BIOPSY     SEPTOPLASTY     TONSILLECTOMY      OB History     Gravida  1   Para  0   Term  0   Preterm  0   AB  0   Living  0      SAB  0   IAB  0   Ectopic  0   Multiple  0   Live Births               Home Medications    Prior to Admission medications   Medication Sig Start Date End Date Taking? Authorizing Provider  diltiazem (TIAZAC) 120 MG 24 hr capsule Take 120 mg by mouth daily. 09/29/21  Yes [provider]  furosemide (LASIX) 40 MG tablet Take 40 mg by mouth daily. 07/27/19  Yes [provider]  albuterol (PROVENTIL HFA;VENTOLIN HFA) 108 (90 Base) MCG/ACT inhaler Inhale 1-2 puffs into  the lungs every 6 (six) hours as needed for wheezing or shortness of breath.    [provider]  azaTHIOprine (IMURAN) 50 MG tablet Take 100 mg by mouth daily.    [provider]  budesonide-formoterol (SYMBICORT) 160-4.5 MCG/ACT inhaler Inhale 2 puffs into the lungs 2 (two) times daily.    [provider]  ibuprofen (ADVIL,MOTRIN) 800 MG tablet Take 1 tablet (800 mg total) by mouth every 8 (eight) hours. 02/03/16   Ward, Delice Bison, DO  lansoprazole (PREVACID) 30 MG capsule Take 30 mg by mouth 2 (two) times daily as needed (for acid reflux).     [provider]  predniSONE (DELTASONE) 5 MG tablet prednisone 5 mg tablet 08/16/16   [provider]  sulfamethoxazole-trimethoprim (BACTRIM,SEPTRA) 400-80 MG tablet Take 1 tablet by mouth daily. 01/18/16   [provider]    Family History Family History  Problem Relation Age of Onset   Asthma Mother    Thyroid disease Mother    Hypertension Mother    Anxiety disorder Mother    Hyperlipidemia Mother    Varicose Veins Mother    Hypertension Father    Asthma Sister    Migraines  Sister    Hypertension Sister    Asthma Brother    Thrombophlebitis Brother    Hypertension Brother    Diabetes Brother     Social History Social History   Tobacco Use   Smoking status: Never   Smokeless tobacco: Never  Substance Use Topics   Alcohol use: No   Drug use: No     Allergies   Avelox [moxifloxacin hcl in nacl], Benadryl [diphenhydramine], Penicillins, and Zantac [ranitidine hcl]   Review of Systems Review of Systems  Cardiovascular:        Patient reports high blood pressure for  All other systems reviewed and are negative.    Physical Exam Triage Vital Signs ED Triage Vitals  Enc Vitals Group     BP      Pulse      Resp      Temp      Temp src      SpO2      Weight      Height      Head Circumference      Peak Flow      Pain Score      Pain Loc      Pain Edu?       Excl. in [place]?    No data found.  Updated Vital Signs BP (!) 155/98 (BP Location: Right Arm)   Pulse 81   Temp 97.8 F (36.6 C) (Oral)   Resp 20   LMP 02/26/2022 (Approximate)   SpO2 98%       Physical Exam Vitals and nursing note reviewed.  Constitutional:      General: She is not in acute distress.    Appearance: Normal appearance. She is obese. She is not ill-appearing, toxic-appearing or diaphoretic.  HENT:     Head: Normocephalic and atraumatic.     Mouth/Throat:     Mouth: Mucous membranes are moist.     Pharynx: Oropharynx is clear.  Eyes:     Extraocular Movements: Extraocular movements intact.     Conjunctiva/sclera: Conjunctivae normal.     Pupils: Pupils are equal, round, and reactive to light.  Neck:     Comments: No JVD, no bruit Cardiovascular:     Rate and Rhythm: Normal rate and regular rhythm.     Pulses: Normal pulses.     Heart sounds: Normal heart sounds. No murmur heard.    Comments: Hypertensive Pulmonary:     Effort: Pulmonary effort is normal.     Breath sounds: Normal breath sounds. No wheezing, rhonchi or rales.  Musculoskeletal:        General: Normal range of motion.     Cervical back: Normal range of motion and neck supple.  Skin:    General: Skin is warm and dry.  Neurological:     General: No focal deficit present.     Mental Status: She is alert and oriented to person, place, and time. Mental status is at baseline.     Cranial Nerves: No cranial nerve deficit.     Motor: No weakness.     Gait: Gait normal.  Psychiatric:        Mood and Affect: Mood normal.        Behavior: Behavior normal.      UC Treatments / Results  Labs (all labs ordered are listed, but only abnormal results are displayed) Labs Reviewed  COMPREHENSIVE METABOLIC PANEL  CBC WITH DIFFERENTIAL/PLATELET  MAGNESIUM    EKG   Radiology No results  found.  Procedures Procedures (including critical care time)  Medications Ordered in UC Medications - No  data to display  Initial Impression / Assessment and Plan / UC Course  I have reviewed the triage vital signs and the nursing notes.  Pertinent labs & imaging results that were available during my care of the patient were reviewed by me and considered in my medical decision making (see chart for details).     MDM: 1.  Elevated blood pressure reading(s) without diagnosis of hypertension-CMP, CBC with differential, magnesium ordered. Advised patient we will follow-up with lab results once received.  Advised patient to check blood pressure twice daily once in the morning after voiding and prior to eating and early evening prior to eating supper.  Advised patient to log these measurements twice daily for 7 days so that PCP may better understand daily blood pressure trends.  Advised if symptoms worsen and/or unresolved please follow-up with PCP or here for further evaluation.  Patient discharged home, hemodynamically stable. Final Clinical Impressions(s) / UC Diagnoses   Final diagnoses:  Elevated blood pressure reading without diagnosis of hypertension     Discharge Instructions      Advised patient we will follow-up with lab results once received.  Advised patient to check blood pressure twice daily once in the morning after voiding and prior to eating and early evening prior to eating supper.  Advised patient to log these measurements twice daily for 7 days so that PCP may better understand daily blood pressure trends.  Advised if symptoms worsen and/or unresolved please follow-up with PCP or here for further evaluation.     ED Prescriptions   None    PDMP not reviewed this encounter.   Eliezer Lofts, [place] 03/12/22 1348

## 2022-03-12 NOTE — Discharge Instructions (Addendum)
Advised patient we will follow-up with lab results once received.  Advised patient to check blood pressure twice daily once in the morning after voiding and prior to eating and early evening prior to eating supper.  Advised patient to log these measurements twice daily for 7 days so that PCP may better understand daily blood pressure trends.  Advised if symptoms worsen and/or unresolved please follow-up with PCP or here for further evaluation.

## 2022-03-13 LAB — COMPREHENSIVE METABOLIC PANEL
ALT: 25 IU/L (ref 0–32)
AST: 23 IU/L (ref 0–40)
Albumin/Globulin Ratio: 1.6 (ref 1.2–2.2)
Albumin: 4.7 g/dL (ref 3.9–4.9)
Alkaline Phosphatase: 79 IU/L (ref 44–121)
BUN/Creatinine Ratio: 25 — ABNORMAL HIGH (ref 9–23)
BUN: 11 mg/dL (ref 6–24)
Bilirubin Total: 0.3 mg/dL (ref 0.0–1.2)
CO2: 20 mmol/L (ref 20–29)
Calcium: 9.9 mg/dL (ref 8.7–10.2)
Chloride: 99 mmol/L (ref 96–106)
Creatinine, Ser: 0.44 mg/dL — ABNORMAL LOW (ref 0.57–1.00)
Globulin, Total: 3 g/dL (ref 1.5–4.5)
Glucose: 89 mg/dL (ref 70–99)
Potassium: 4 mmol/L (ref 3.5–5.2)
Sodium: 137 mmol/L (ref 134–144)
Total Protein: 7.7 g/dL (ref 6.0–8.5)
eGFR: 121 mL/min/{1.73_m2} (ref >59)

## 2022-03-13 LAB — MAGNESIUM: Magnesium: 1.9 mg/dL (ref 1.6–2.3)

## 2022-03-13 LAB — CBC WITH DIFFERENTIAL/PLATELET
Basophils Absolute: 0.1 10*3/uL (ref 0.0–0.2)
Basos: 2 %
EOS (ABSOLUTE): 0 10*3/uL (ref 0.0–0.4)
Eos: 0 %
Hematocrit: 40 % (ref 34.0–46.6)
Hemoglobin: 13.2 g/dL (ref 11.1–15.9)
Immature Grans (Abs): 0 10*3/uL (ref 0.0–0.1)
Immature Granulocytes: 1 %
Lymphocytes Absolute: 0.9 10*3/uL (ref 0.7–3.1)
Lymphs: 12 %
MCH: 27.8 pg (ref 26.6–33.0)
MCHC: 33 g/dL (ref 31.5–35.7)
MCV: 84 fL (ref 79–97)
Monocytes Absolute: 0.8 10*3/uL (ref 0.1–0.9)
Monocytes: 10 %
Neutrophils Absolute: 5.9 10*3/uL (ref 1.4–7.0)
Neutrophils: 75 %
Platelets: 323 10*3/uL (ref 150–450)
RBC: 4.75 x10E6/uL (ref 3.77–5.28)
RDW: 13.9 % (ref 11.7–15.4)
WBC: 7.7 10*3/uL (ref 3.4–10.8)

## 2022-06-04 ENCOUNTER — Ambulatory Visit
Admission: EM | Admit: 2022-06-04 | Discharge: 2022-06-04 | Disposition: A | Discharge status: Home / Self Care | Payer: Medicaid Other | Attending: Internal Medicine | Admitting: Internal Medicine

## 2022-06-04 DIAGNOSIS — J209 Acute bronchitis, unspecified: Secondary | ICD-10-CM | POA: Insufficient documentation

## 2022-06-04 DIAGNOSIS — D8989 Other specified disorders involving the immune mechanism, not elsewhere classified: Secondary | ICD-10-CM | POA: Insufficient documentation

## 2022-06-04 DIAGNOSIS — U071 COVID-19: Secondary | ICD-10-CM | POA: Diagnosis not present

## 2022-06-04 DIAGNOSIS — J0100 Acute maxillary sinusitis, unspecified: Secondary | ICD-10-CM | POA: Insufficient documentation

## 2022-06-04 DIAGNOSIS — Z20822 Contact with and (suspected) exposure to covid-19: Secondary | ICD-10-CM | POA: Diagnosis not present

## 2022-06-04 LAB — POCT INFLUENZA A/B
Influenza A, POC: NEGATIVE
Influenza B, POC: NEGATIVE

## 2022-06-04 MED ORDER — DOXYCYCLINE HYCLATE 100 MG PO CAPS: 100.0000 mg | ORAL_CAPSULE | Freq: Two times a day (BID) | ORAL | 0 refills | Status: AC

## 2022-06-04 NOTE — ED Triage Notes (Signed)
Pt c/o chest congestion, sore throat, watery eyes, runny nose, HA, dizziness since yesterday.  States she is immunocompromised.

## 2022-06-04 NOTE — Discharge Instructions (Addendum)
Your flu test was negative and your COVID test has been sent to the lab. Someone from our office will call you with your results. A prescription was sent for Doxycycline. This is an antibiotic used to treat upper respiratory symptoms. Take as directed. Return in 2 to 3 days if no improvement. It is very important for you to pay attention to any new symptoms or worsening of your current condition.  Please go directly to the Emergency Department immediately should you begin to have any of the following symptoms: shortness of breath, chest pain or difficulty breathing.  

## 2022-06-04 NOTE — ED Provider Notes (Signed)
[facility]  Note:  This document was prepared using Dragon voice recognition software and may include unintentional dictation errors.  MRN: [id number] DOB: 05-Nov-1975 DATE: 06/04/22   Subjective:  Chief Complaint:  Chief Complaint  Patient presents with   Nasal Congestion   Headache     HPI: Allison Obrien is a 47 y.o. female presenting for productive cough and nasal congestion for 1 day.  Patient reports COVID exposure. Patient is concerned because she is immunocompromised with lung disorder.  She states she has been having a productive cough with chest congestion as well as nasal congestion.  She has not tried anything for symptoms.  She also endorses a sore throat and rhinorrhea. Denies fever, nausea/vomiting, abdominal pain. Endorses productive cough, nasal congestion, sore throat. Presents NAD.  Prior to Admission medications   Medication Sig Start Date End Date Taking? Authorizing Provider  albuterol (PROVENTIL HFA;VENTOLIN HFA) 108 (90 Base) MCG/ACT inhaler Inhale 1-2 puffs into the lungs every 6 (six) hours as needed for wheezing or shortness of breath.    [provider]  azaTHIOprine (IMURAN) 50 MG tablet Take 100 mg by mouth daily.    [provider]  budesonide-formoterol (SYMBICORT) 160-4.5 MCG/ACT inhaler Inhale 2 puffs into the lungs 2 (two) times daily.    [provider]  budesonide-formoterol (SYMBICORT) 80-4.5 MCG/ACT inhaler Inhale 2 puffs into the lungs.    [provider]  diltiazem (TIAZAC) 120 MG 24 hr capsule Take 120 mg by mouth daily. 09/29/21   [provider]  furosemide (LASIX) 40 MG tablet Take 40 mg by mouth daily. 07/27/19   [provider]  ibuprofen (ADVIL,MOTRIN) 800 MG tablet Take 1 tablet (800 mg total) by mouth every 8 (eight) hours. 02/03/16   Ward, Layla Maw, DO  lansoprazole (PREVACID) 30 MG capsule Take 30 mg by mouth 2 (two) times daily as needed (for acid reflux).     [provider]  losartan (COZAAR) 25 MG tablet Take 25 mg by mouth daily.    [provider]  predniSONE (DELTASONE) 20 MG tablet Take 40 mg by mouth daily.    [provider]  predniSONE (DELTASONE) 5 MG tablet prednisone 5 mg tablet 08/16/16   [provider]  sulfamethoxazole-trimethoprim (BACTRIM,SEPTRA) 400-80 MG tablet Take 1 tablet by mouth daily. 01/18/16   [provider]     Allergies  Allergen Reactions   Avelox [Moxifloxacin Hcl In Nacl] Shortness Of Breath    Rash    Benadryl [Diphenhydramine] Shortness Of Breath   Penicillins Shortness Of Breath    Has patient had a PCN reaction causing immediate rash, facial/tongue/throat swelling, SOB or lightheadedness with hypotension: Yes Has patient had a PCN reaction causing severe rash involving mucus membranes or skin necrosis: Yes Has patient had a PCN reaction that required hospitalization Yes Has patient had a PCN reaction occurring within the last 10 years: No If all of the above answers are "NO", then may proceed with Cephalosporin use.    Zantac [Ranitidine Hcl] Shortness Of Breath    History:   Past Medical History:  Diagnosis Date   Asthma    Autoimmune disorder (HCC)    Esophagitis    Reflux      Past Surgical History:  Procedure Laterality Date   CESAREAN SECTION     NERVE BIOPSY     SEPTOPLASTY     TONSILLECTOMY      Family History  Problem Relation Age of Onset  Asthma Mother    Thyroid disease Mother    Hypertension Mother    Anxiety disorder Mother    Hyperlipidemia Mother    Varicose Veins Mother    Hypertension Father    Asthma Sister    Migraines Sister    Hypertension Sister    Asthma Brother    Thrombophlebitis Brother    Hypertension Brother    Diabetes Brother     Social History   Tobacco Use   Smoking status: Never   Smokeless tobacco: Never  Vaping Use   Vaping Use: Never used  Substance Use Topics   Alcohol use: No   Drug use: No     Review of Systems  Constitutional:  Positive for chills. Negative for fever.  HENT:  Positive for congestion, rhinorrhea, sinus pressure and sore throat. Negative for ear pain.   Respiratory:  Positive for cough.   Gastrointestinal:  Negative for abdominal pain, nausea and vomiting.  Musculoskeletal:  Positive for myalgias.  Neurological:  Positive for headaches.     Objective:   Vitals: BP (!) 148/88 (BP Location: Right Arm)   Pulse 80   Temp 97.9 F (36.6 C) (Oral)   Resp 16   LMP 06/03/2022 (Exact Date) Comment: spotting yesterday  SpO2 99%   Physical Exam Constitutional:      General: She is not in acute distress.    Appearance: Normal appearance. She is well-developed. She is obese. She is not ill-appearing or toxic-appearing.  HENT:     Head: Normocephalic and atraumatic.     Right Ear: Tympanic membrane and ear canal normal.     Left Ear: Tympanic membrane and ear canal normal.     Mouth/Throat:     Pharynx: Oropharynx is clear. Uvula midline. No pharyngeal swelling, oropharyngeal exudate or posterior oropharyngeal erythema.     Tonsils: No tonsillar exudate or tonsillar abscesses.  Cardiovascular:     Rate and Rhythm: Normal rate and regular rhythm.     Heart sounds: Normal heart sounds.  Pulmonary:     Effort: Pulmonary effort is normal.     Breath sounds: Normal breath sounds.     Comments: Clear to auscultation bilaterally  Abdominal:     General: Bowel sounds are normal.     Palpations: Abdomen is soft.     Tenderness: There is no abdominal tenderness.  Skin:    General: Skin is warm and dry.  Neurological:     General: No focal deficit present.     Mental Status: She is alert.  Psychiatric:        Mood and Affect: Mood and affect normal.     Results:  Labs: Results for orders placed or performed during the hospital encounter of 06/04/22 (from the past 24 hour(s))  POCT Influenza A/B     Status: None   Collection Time: 06/04/22 10:07 AM   Result Value Ref Range   Influenza A, POC Negative Negative   Influenza B, POC Negative Negative    Radiology: No results found.   UC Course/Treatments:  Procedures: Procedures   Medications Ordered in UC: Medications - No data to display   Assessment and Plan :     ICD-10-CM   1. Acute bronchitis, unspecified organism  J20.9 SARS CORONAVIRUS 2 (TAT 6-24 HRS) Anterior Nasal Swab    SARS CORONAVIRUS 2 (TAT 6-24 HRS) Anterior Nasal Swab    2. Acute non-recurrent maxillary sinusitis  J01.00      Acute bronchitis: Afebrile, nontoxic-appearing, NAD. VSS. DDX  includes but not limited to: COVID, flu, bronchitis, pneumonia, asthma Flu was negative today in office. COVID is pending. COVID is suspected given sick contacts.  Doxycycline 100 mg twice daily was prescribed to empirically treat bacterial infection secondary to viral infection while waiting for COVID results given that patient is immunocompromised.   Recommend over-the-counter decongestant as needed.  Strict ED precautions were given and patient verbalized understanding.  Acute nonrecurrent maxillary sinusitis: Afebrile, nontoxic-appearing, NAD. VSS. DDX includes but not limited to: COVID, flu, bronchitis, pneumonia, asthma Flu was negative today in office. COVID is pending. COVID is suspected given sick contacts.  Doxycycline 100 mg twice daily was prescribed to empirically treat bacterial infection secondary to viral infection while waiting for COVID results.  Recommend over-the-counter decongestant as needed. Strict ED precautions were given and patient verbalized understanding.  ED Discharge Orders          Ordered    doxycycline (VIBRAMYCIN) 100 MG capsule  2 times daily        06/04/22 1013             PDMP not reviewed this encounter.     Kameryn Tisdel P, PA-C 06/04/22 1018

## 2022-06-05 LAB — SARS CORONAVIRUS 2 (TAT 6-24 HRS): SARS Coronavirus 2: POSITIVE — AB
